# Patient Record
Sex: Female | Born: 1935 | Race: White | Hispanic: No | Smoking: Current every day smoker
Health system: Southern US, Community
[De-identification: ages and names within clinical notes are randomized; demographics above are authoritative.]

## PROBLEM LIST (undated history)

## (undated) DIAGNOSIS — F419 Anxiety disorder, unspecified: Secondary | ICD-10-CM

## (undated) DIAGNOSIS — K635 Polyp of colon: Secondary | ICD-10-CM

## (undated) DIAGNOSIS — I10 Essential (primary) hypertension: Secondary | ICD-10-CM

## (undated) DIAGNOSIS — E785 Hyperlipidemia, unspecified: Secondary | ICD-10-CM

## (undated) DIAGNOSIS — K219 Gastro-esophageal reflux disease without esophagitis: Secondary | ICD-10-CM

## (undated) DIAGNOSIS — E079 Disorder of thyroid, unspecified: Secondary | ICD-10-CM

## (undated) DIAGNOSIS — B019 Varicella without complication: Secondary | ICD-10-CM

## (undated) DIAGNOSIS — M1990 Unspecified osteoarthritis, unspecified site: Secondary | ICD-10-CM

## (undated) DIAGNOSIS — H3530 Unspecified macular degeneration: Secondary | ICD-10-CM

## (undated) HISTORY — DX: Unspecified macular degeneration: H35.30

## (undated) HISTORY — DX: Anxiety disorder, unspecified: F41.9

## (undated) HISTORY — DX: Gastro-esophageal reflux disease without esophagitis: K21.9

## (undated) HISTORY — DX: Essential (primary) hypertension: I10

## (undated) HISTORY — DX: Unspecified osteoarthritis, unspecified site: M19.90

## (undated) HISTORY — DX: Disorder of thyroid, unspecified: E07.9

## (undated) HISTORY — DX: Varicella without complication: B01.9

## (undated) HISTORY — DX: Polyp of colon: K63.5

## (undated) HISTORY — DX: Hyperlipidemia, unspecified: E78.5

---
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
## 1969-08-10 HISTORY — PX: TOTAL ABDOMINAL HYSTERECTOMY W/ BILATERAL SALPINGOOPHORECTOMY: SHX83

## 1994-08-10 HISTORY — PX: KNEE DISLOCATION SURGERY: SHX689

## 2004-11-06 ENCOUNTER — Ambulatory Visit: Payer: Self-pay | Admitting: Unknown Physician Specialty

## 2005-11-26 ENCOUNTER — Ambulatory Visit: Payer: Self-pay | Admitting: Unknown Physician Specialty

## 2006-02-18 ENCOUNTER — Ambulatory Visit: Payer: Self-pay | Admitting: Gastroenterology

## 2006-11-30 ENCOUNTER — Ambulatory Visit: Payer: Self-pay | Admitting: Unknown Physician Specialty

## 2007-12-23 ENCOUNTER — Ambulatory Visit: Payer: Self-pay | Admitting: Unknown Physician Specialty

## 2008-12-26 ENCOUNTER — Ambulatory Visit: Payer: Self-pay | Admitting: Unknown Physician Specialty

## 2009-12-31 ENCOUNTER — Ambulatory Visit: Payer: Self-pay | Admitting: Unknown Physician Specialty

## 2010-08-10 LAB — HM PAP SMEAR

## 2010-08-10 LAB — HM MAMMOGRAPHY

## 2011-01-20 ENCOUNTER — Ambulatory Visit: Payer: Self-pay | Admitting: Unknown Physician Specialty

## 2011-02-07 ENCOUNTER — Emergency Department: Payer: Self-pay | Admitting: Unknown Physician Specialty

## 2011-02-13 ENCOUNTER — Ambulatory Visit: Payer: Self-pay | Admitting: Unknown Physician Specialty

## 2011-04-14 ENCOUNTER — Ambulatory Visit: Payer: Self-pay | Admitting: Gastroenterology

## 2012-04-06 ENCOUNTER — Ambulatory Visit (INDEPENDENT_AMBULATORY_CARE_PROVIDER_SITE_OTHER): Payer: 59 | Admitting: Internal Medicine

## 2012-04-06 ENCOUNTER — Encounter: Payer: Self-pay | Admitting: Internal Medicine

## 2012-04-06 VITALS — BP 110/70 | HR 70 | Temp 98.6°F | Ht 62.0 in | Wt 162.5 lb

## 2012-04-06 DIAGNOSIS — Z1239 Encounter for other screening for malignant neoplasm of breast: Secondary | ICD-10-CM

## 2012-04-06 DIAGNOSIS — E785 Hyperlipidemia, unspecified: Secondary | ICD-10-CM

## 2012-04-06 DIAGNOSIS — Z23 Encounter for immunization: Secondary | ICD-10-CM

## 2012-04-06 DIAGNOSIS — D649 Anemia, unspecified: Secondary | ICD-10-CM

## 2012-04-06 DIAGNOSIS — I10 Essential (primary) hypertension: Secondary | ICD-10-CM

## 2012-04-06 LAB — CBC WITH DIFFERENTIAL/PLATELET
Basophils Relative: 1.2 % (ref 0.0–3.0)
Eosinophils Absolute: 0.2 10*3/uL (ref 0.0–0.7)
MCHC: 32.8 g/dL (ref 30.0–36.0)
MCV: 100.2 fl — ABNORMAL HIGH (ref 78.0–100.0)
Monocytes Absolute: 0.6 10*3/uL (ref 0.1–1.0)
Neutrophils Relative %: 65.7 % (ref 43.0–77.0)
RBC: 4.14 Mil/uL (ref 3.87–5.11)

## 2012-04-06 LAB — COMPREHENSIVE METABOLIC PANEL
AST: 21 U/L (ref 0–37)
Alkaline Phosphatase: 61 U/L (ref 39–117)
BUN: 20 mg/dL (ref 6–23)
Calcium: 9.8 mg/dL (ref 8.4–10.5)
Creatinine, Ser: 1.1 mg/dL (ref 0.4–1.2)
Total Bilirubin: 0.7 mg/dL (ref 0.3–1.2)

## 2012-04-06 LAB — LIPID PANEL
HDL: 49.6 mg/dL (ref >39.00)
Total CHOL/HDL Ratio: 4
Triglycerides: 167 mg/dL — ABNORMAL HIGH (ref 0.0–149.0)
VLDL: 33.4 mg/dL (ref 0.0–40.0)

## 2012-04-06 LAB — LDL CHOLESTEROL, DIRECT: Direct LDL: 131.7 mg/dL

## 2012-04-06 LAB — MICROALBUMIN / CREATININE URINE RATIO: Microalb, Ur: 0.2 mg/dL (ref 0.0–1.9)

## 2012-04-06 NOTE — Progress Notes (Signed)
Subjective:    Patient ID: Joann [name], female    DOB: 03/22/1933, 78 y.o.   MRN: [id number]  HPI 78 year old female with history of hypertension and hyperlipidemia presents to establish care. She reports she is generally feeling well. She reports full compliance with her medications. She denies any chest pain, palpitations, headache. She reports normal energy level. She is active participating in activities with family members including her husband of 56 years.  Outpatient Encounter Prescriptions as of 04/06/2012  Medication Sig Dispense Refill  . aspirin 81 MG tablet Take 81 mg by mouth daily.      Marland Kitchen atorvastatin (LIPITOR) 10 MG tablet Take 10 mg by mouth daily.      . Cholecalciferol (VITAMIN D-3) 1000 UNITS CAPS Take 1 capsule by mouth daily.      Marland Kitchen esomeprazole (NEXIUM) 40 MG capsule Take 40 mg by mouth daily before breakfast.      . etodolac (LODINE) 500 MG tablet Take 500 mg by mouth 2 (two) times daily.      Marland Kitchen ibandronate (BONIVA) 150 MG tablet Take 150 mg by mouth every 30 (thirty) days. Take in the morning with a full glass of water, on an empty stomach, and do not take anything else by mouth or lie down for the next 30 min.      Marland Kitchen levothyroxine (SYNTHROID, LEVOTHROID) 25 MCG tablet Take 25 mcg by mouth daily.      . magnesium 30 MG tablet Take 30 mg by mouth daily.      . Nutritional Supplements (ESTROVEN WEIGHT MANAGEMENT PO) Take 1 tablet by mouth 2 (two) times daily.      Marland Kitchen nystatin-triamcinolone (MYCOLOG II) cream Apply 1 application topically as needed.      . Simethicone (GAS RELIEF 125 MAX ST PO) Take 1 tablet by mouth as needed.      . Triamterene-HCTZ (MAXZIDE-25 PO) Take 1 tablet by mouth daily.      . valsartan (DIOVAN) 160 MG tablet Take 160 mg by mouth daily.       BP 110/70  Pulse 70  Temp 98.6 F (37 C) (Oral)  Ht 5\' 2"  (1.575 m)  Wt 162 lb 8 oz (73.71 kg)  BMI 29.72 kg/m2  SpO2 94%  Review of Systems  Constitutional: Negative for fever, chills, appetite  change, fatigue and unexpected weight change.  HENT: Negative for ear pain, congestion, sore throat, trouble swallowing, neck pain, voice change and sinus pressure.   Eyes: Negative for visual disturbance.  Respiratory: Negative for cough, shortness of breath, wheezing and stridor.   Cardiovascular: Negative for chest pain, palpitations and leg swelling.  Gastrointestinal: Negative for nausea, vomiting, abdominal pain, diarrhea, constipation, blood in stool, abdominal distention and anal bleeding.  Genitourinary: Negative for dysuria and flank pain.  Musculoskeletal: Negative for myalgias, arthralgias and gait problem.  Skin: Negative for color change and rash.  Neurological: Negative for dizziness and headaches.  Hematological: Negative for adenopathy. Does not bruise/bleed easily.  Psychiatric/Behavioral: Negative for suicidal ideas, disturbed wake/sleep cycle and dysphoric mood. The patient is not nervous/anxious.        Objective:   Physical Exam  Constitutional: She is oriented to person, place, and time. She appears well-developed and well-nourished. No distress.  HENT:  Head: Normocephalic and atraumatic.  Right Ear: External ear normal.  Left Ear: External ear normal.  Nose: Nose normal.  Mouth/Throat: Oropharynx is clear and moist. No oropharyngeal exudate.  Eyes: Conjunctivae are normal. Pupils are equal, round, and reactive to  light. Right eye exhibits no discharge. Left eye exhibits no discharge. No scleral icterus.  Neck: Normal range of motion. Neck supple. No tracheal deviation present. No thyromegaly present.  Cardiovascular: Normal rate, regular rhythm, normal heart sounds and intact distal pulses.  Exam reveals no gallop and no friction rub.   No murmur heard. Pulmonary/Chest: Effort normal and breath sounds normal. No respiratory distress. She has no wheezes. She has no rales. She exhibits no tenderness.  Abdominal: Soft. Bowel sounds are normal. She exhibits no  distension. There is no tenderness.  Musculoskeletal: Normal range of motion. She exhibits no edema and no tenderness.  Lymphadenopathy:    She has no cervical adenopathy.  Neurological: She is alert and oriented to person, place, and time. No cranial nerve deficit. She exhibits normal muscle tone. Coordination normal.  Skin: Skin is warm and dry. No rash noted. She is not diaphoretic. No erythema. No pallor.  Psychiatric: She has a normal mood and affect. Her behavior is normal. Judgment and thought content normal.          Assessment & Plan:

## 2012-04-06 NOTE — Assessment & Plan Note (Signed)
Will check lipids and LFTs with labs today. Continue atorvastatin. Followup in 3 months.

## 2012-04-06 NOTE — Assessment & Plan Note (Signed)
Blood pressures well-controlled on current medications. Will check renal function with labs today. Followup for physical exam in 4 months.

## 2012-04-08 ENCOUNTER — Ambulatory Visit: Payer: Self-pay | Admitting: Internal Medicine

## 2012-04-18 LAB — HM MAMMOGRAPHY: HM Mammogram: NORMAL

## 2012-04-20 ENCOUNTER — Encounter: Payer: Self-pay | Admitting: Internal Medicine

## 2012-07-18 ENCOUNTER — Encounter: Payer: Self-pay | Admitting: Internal Medicine

## 2012-07-18 ENCOUNTER — Ambulatory Visit (INDEPENDENT_AMBULATORY_CARE_PROVIDER_SITE_OTHER): Payer: Medicare Other | Admitting: Internal Medicine

## 2012-07-18 VITALS — BP 120/72 | HR 78 | Temp 97.6°F | Resp 16 | Ht 63.0 in | Wt 164.0 lb

## 2012-07-18 DIAGNOSIS — Z0000 Encounter for general adult medical examination without abnormal findings: Secondary | ICD-10-CM

## 2012-07-18 DIAGNOSIS — Z1331 Encounter for screening for depression: Secondary | ICD-10-CM

## 2012-07-18 MED ORDER — TRIAMTERENE-HCTZ 37.5-25 MG PO TABS: 1.0000 | ORAL_TABLET | Freq: Every day | ORAL | Status: DC

## 2012-07-18 MED ORDER — ESOMEPRAZOLE MAGNESIUM 40 MG PO CPDR: 40.0000 mg | DELAYED_RELEASE_CAPSULE | Freq: Every day | ORAL | Status: DC

## 2012-07-18 MED ORDER — LEVOTHYROXINE SODIUM 25 MCG PO TABS: 25.0000 ug | ORAL_TABLET | Freq: Every day | ORAL | Status: DC

## 2012-07-18 MED ORDER — VALSARTAN 160 MG PO TABS: 160.0000 mg | ORAL_TABLET | Freq: Every day | ORAL | Status: DC

## 2012-07-18 MED ORDER — ATORVASTATIN CALCIUM 10 MG PO TABS: 10.0000 mg | ORAL_TABLET | Freq: Every day | ORAL | Status: DC

## 2012-07-18 MED ORDER — IBANDRONATE SODIUM 150 MG PO TABS: 150.0000 mg | ORAL_TABLET | ORAL | Status: DC

## 2012-07-18 MED ORDER — ETODOLAC 500 MG PO TABS: 500.0000 mg | ORAL_TABLET | Freq: Two times a day (BID) | ORAL | Status: DC

## 2012-07-18 NOTE — Progress Notes (Signed)
Subjective:    Patient ID: Joann [name], female    DOB: [birth year], 78 y.o.   MRN: [id number]  HPI The patient is here for annual Medicare wellness examination and management of other chronic and acute problems.   The risk factors are reflected in the social history.  The roster of all physicians providing medical care to patient - is listed in the Snapshot section of the chart.  Activities of daily living:  The patient is 100% independent in all ADLs: dressing, toileting, feeding as well as independent mobility  Home safety : The patient has smoke detectors in the home. They wear seatbelts.  There are locked firearms at home. There is no violence in the home.   There is no risks for hepatitis, STDs or HIV. There is no history of blood transfusion. They have no travel history to infectious disease endemic areas of the world.  The patient has seen their dentist in the last six month. (Dr. Smitty Cords) They have seen their eye doctor in the last year. Followed for macular degeneration. (Dr. Burnell Blanks and Dr. Allyne Gee) No issues with hearing. They have deferred audiologic testing in the last year.   They do not  have excessive sun exposure. Discussed the need for sun protection: hats, long sleeves and use of sunscreen if there is significant sun exposure. (Dr. Orson Aloe)  Diet: the importance of a healthy diet is discussed. They do have a healthy diet.  The benefits of regular aerobic exercise were discussed. She walks several times per week. Limited by arthritis.  Depression screen: there are no signs or vegative symptoms of depression- irritability, change in appetite, anhedonia, sadness/tearfullness.  Cognitive assessment: the patient manages all their financial and personal affairs and is actively engaged. They could relate day,date,year and events.  The following portions of the patient's history were reviewed and updated as appropriate: allergies, current medications, past family  history, past medical history,  past surgical history, past social history  and problem list.  Visual acuity was not assessed per patient preference since she has regular follow up with her ophthalmologist. Hearing and body mass index were assessed and reviewed.   During the course of the visit the patient was educated and counseled about appropriate screening and preventive services including : fall prevention , diabetes screening, nutrition counseling, colorectal cancer screening, and recommended immunizations.     Outpatient Encounter Prescriptions as of 07/18/2012  Medication Sig Dispense Refill  . aspirin 81 MG tablet Take 81 mg by mouth daily.      Marland Kitchen atorvastatin (LIPITOR) 10 MG tablet Take 10 mg by mouth daily.      . Cholecalciferol (VITAMIN D-3) 1000 UNITS CAPS Take 1 capsule by mouth daily.      Marland Kitchen esomeprazole (NEXIUM) 40 MG capsule Take 40 mg by mouth daily before breakfast.      . etodolac (LODINE) 500 MG tablet Take 500 mg by mouth 2 (two) times daily.      Marland Kitchen ibandronate (BONIVA) 150 MG tablet Take 150 mg by mouth every 30 (thirty) days. Take in the morning with a full glass of water, on an empty stomach, and do not take anything else by mouth or lie down for the next 30 min.      Marland Kitchen levothyroxine (SYNTHROID, LEVOTHROID) 25 MCG tablet Take 25 mcg by mouth daily.      . magnesium 30 MG tablet Take 30 mg by mouth daily.      . Nutritional Supplements (ESTROVEN WEIGHT MANAGEMENT PO) Take 1  tablet by mouth 2 (two) times daily.      Marland Kitchen nystatin-triamcinolone (MYCOLOG II) cream Apply 1 application topically as needed.      . Simethicone (GAS RELIEF 125 MAX ST PO) Take 1 tablet by mouth as needed.      . Triamterene-HCTZ (MAXZIDE-25 PO) Take 1 tablet by mouth daily.      . valsartan (DIOVAN) 160 MG tablet Take 160 mg by mouth daily.       BP 120/72  Pulse 78  Temp 97.6 F (36.4 C) (Oral)  Resp 16  Ht 5\' 3"  (1.6 m)  Wt 164 lb (74.39 kg)  BMI 29.05 kg/m2  SpO2 97%  Review of Systems   Constitutional: Negative for fever, chills, appetite change, fatigue and unexpected weight change.  HENT: Negative for ear pain, congestion, sore throat, trouble swallowing, neck pain, voice change and sinus pressure.   Eyes: Negative for visual disturbance.  Respiratory: Negative for cough, shortness of breath, wheezing and stridor.   Cardiovascular: Negative for chest pain, palpitations and leg swelling.  Gastrointestinal: Negative for nausea, vomiting, abdominal pain, diarrhea, constipation, blood in stool, abdominal distention and anal bleeding.  Genitourinary: Negative for dysuria and flank pain.  Musculoskeletal: Negative for myalgias, arthralgias and gait problem.  Skin: Negative for color change and rash.  Neurological: Negative for dizziness and headaches.  Hematological: Negative for adenopathy. Does not bruise/bleed easily.  Psychiatric/Behavioral: Negative for suicidal ideas, sleep disturbance and dysphoric mood. The patient is not nervous/anxious.    Outpatient Encounter Prescriptions as of 07/18/2012  Medication Sig Dispense Refill  . aspirin 81 MG tablet Take 81 mg by mouth daily.      Marland Kitchen atorvastatin (LIPITOR) 10 MG tablet Take 1 tablet (10 mg total) by mouth daily.  90 tablet  4  . Cholecalciferol (VITAMIN D-3) 1000 UNITS CAPS Take 1 capsule by mouth daily.      Marland Kitchen esomeprazole (NEXIUM) 40 MG capsule Take 1 capsule (40 mg total) by mouth daily before breakfast.  90 capsule  4  . etodolac (LODINE) 500 MG tablet Take 1 tablet (500 mg total) by mouth 2 (two) times daily.  180 tablet  4  . ibandronate (BONIVA) 150 MG tablet Take 1 tablet (150 mg total) by mouth every 30 (thirty) days. Take in the morning with a full glass of water, on an empty stomach, and do not take anything else by mouth or lie down for the next 30 min.  12 tablet  4  . levothyroxine (SYNTHROID, LEVOTHROID) 25 MCG tablet Take 1 tablet (25 mcg total) by mouth daily.  90 tablet  4  . magnesium 30 MG tablet Take 30  mg by mouth daily.      . Nutritional Supplements (ESTROVEN WEIGHT MANAGEMENT PO) Take 1 tablet by mouth 2 (two) times daily.      Marland Kitchen nystatin-triamcinolone (MYCOLOG II) cream Apply 1 application topically as needed.      . Simethicone (GAS RELIEF 125 MAX ST PO) Take 1 tablet by mouth as needed.      . triamterene-hydrochlorothiazide (MAXZIDE-25) 37.5-25 MG per tablet Take 1 each (1 tablet total) by mouth daily.  90 tablet  4  . valsartan (DIOVAN) 160 MG tablet Take 1 tablet (160 mg total) by mouth daily.  90 tablet  4   BP 120/72  Pulse 78  Temp 97.6 F (36.4 C) (Oral)  Resp 16  Ht 5\' 3"  (1.6 m)  Wt 164 lb (74.39 kg)  BMI 29.05 kg/m2  SpO2 97%  Objective:   Physical Exam  Constitutional: She is oriented to person, place, and time. She appears well-developed and well-nourished. No distress.  HENT:  Head: Normocephalic and atraumatic.  Right Ear: External ear normal.  Left Ear: External ear normal.  Nose: Nose normal.  Mouth/Throat: Oropharynx is clear and moist. No oropharyngeal exudate.  Eyes: Conjunctivae normal are normal. Pupils are equal, round, and reactive to light. Right eye exhibits no discharge. Left eye exhibits no discharge. No scleral icterus.  Neck: Normal range of motion. Neck supple. No tracheal deviation present. No thyromegaly present.  Cardiovascular: Normal rate, regular rhythm, normal heart sounds and intact distal pulses.  Exam reveals no gallop and no friction rub.   No murmur heard. Pulmonary/Chest: Effort normal and breath sounds normal. No accessory muscle usage. Not tachypneic. No respiratory distress. She has no decreased breath sounds. She has no wheezes. She has no rhonchi. She has no rales. She exhibits no tenderness. Right breast exhibits no inverted nipple, no mass, no nipple discharge, no skin change and no tenderness. Left breast exhibits no inverted nipple, no mass, no nipple discharge, no skin change and no tenderness. Breasts are symmetrical.   Abdominal: Soft. Bowel sounds are normal. She exhibits no distension and no mass. There is no tenderness. There is no rebound and no guarding.  Musculoskeletal: Normal range of motion. She exhibits no edema and no tenderness.  Lymphadenopathy:    She has no cervical adenopathy.  Neurological: She is alert and oriented to person, place, and time. No cranial nerve deficit. She exhibits normal muscle tone. Coordination normal.  Skin: Skin is warm and dry. No rash noted. She is not diaphoretic. No erythema. No pallor.  Psychiatric: She has a normal mood and affect. Her behavior is normal. Judgment and thought content normal.          Assessment & Plan:

## 2012-07-18 NOTE — Assessment & Plan Note (Signed)
General medical exam including breast exam is normal today. Pap is deferred as this was normal in 2012. Health maintenance is up to date. Reviewed recent labs including kidney and liver function and cholesterol. Encouraged continued efforts at healthy diet and regular physical activity. Appropriate screening performed. Followup in 4 months or sooner as needed.

## 2012-07-20 ENCOUNTER — Other Ambulatory Visit: Payer: Self-pay | Admitting: General Practice

## 2012-07-20 MED ORDER — IBANDRONATE SODIUM 150 MG PO TABS: 150.0000 mg | ORAL_TABLET | ORAL | Status: DC

## 2012-11-21 ENCOUNTER — Encounter: Payer: Self-pay | Admitting: Internal Medicine

## 2012-11-21 ENCOUNTER — Ambulatory Visit (INDEPENDENT_AMBULATORY_CARE_PROVIDER_SITE_OTHER): Payer: Medicare Other | Admitting: Internal Medicine

## 2012-11-21 VITALS — BP 130/70 | HR 72 | Temp 97.9°F | Wt 165.0 lb

## 2012-11-21 DIAGNOSIS — L259 Unspecified contact dermatitis, unspecified cause: Secondary | ICD-10-CM

## 2012-11-21 DIAGNOSIS — E785 Hyperlipidemia, unspecified: Secondary | ICD-10-CM

## 2012-11-21 DIAGNOSIS — G5602 Carpal tunnel syndrome, left upper limb: Secondary | ICD-10-CM

## 2012-11-21 DIAGNOSIS — G5600 Carpal tunnel syndrome, unspecified upper limb: Secondary | ICD-10-CM

## 2012-11-21 DIAGNOSIS — E039 Hypothyroidism, unspecified: Secondary | ICD-10-CM

## 2012-11-21 DIAGNOSIS — L309 Dermatitis, unspecified: Secondary | ICD-10-CM

## 2012-11-21 DIAGNOSIS — I10 Essential (primary) hypertension: Secondary | ICD-10-CM

## 2012-11-21 LAB — COMPREHENSIVE METABOLIC PANEL
Alkaline Phosphatase: 55 U/L (ref 39–117)
BUN: 25 mg/dL — ABNORMAL HIGH (ref 6–23)
CO2: 27 mEq/L (ref 19–32)
Creatinine, Ser: 1.1 mg/dL (ref 0.4–1.2)
GFR: 53.34 mL/min — ABNORMAL LOW (ref >60.00)
Glucose, Bld: 90 mg/dL (ref 70–99)
Sodium: 139 mEq/L (ref 135–145)
Total Bilirubin: 0.6 mg/dL (ref 0.3–1.2)
Total Protein: 7.5 g/dL (ref 6.0–8.3)

## 2012-11-21 LAB — MICROALBUMIN / CREATININE URINE RATIO
Creatinine,U: 40 mg/dL
Microalb Creat Ratio: 1 mg/g (ref 0.0–30.0)
Microalb, Ur: 0.4 mg/dL (ref 0.0–1.9)

## 2012-11-21 LAB — TSH: TSH: 1.62 u[IU]/mL (ref 0.35–5.50)

## 2012-11-21 NOTE — Assessment & Plan Note (Signed)
Symptoms most consistent with contact dermatitis. Discussed using topical steroid cream. Patient would prefer to wait till she discussed this with her dermatologist later this week. Follow up prn.

## 2012-11-21 NOTE — Progress Notes (Signed)
Subjective:    Patient ID: Joann [name], female    DOB: April 25, 1933, 79 y.o.   MRN: [id number]  HPI 79 year old female with history of hypertension, hyperlipidemia presents for followup. She reports she is generally feeling well. She denies any recent chest pain, shortness of breath, headache. She is compliant with her medication. She has 2 concerns today. First, she notes a couple of weeks ago a red itchy rash developed on her right medial thigh. She denies any exposure to new lotions, perfumes, detergents. Over time, the rash is going away without any intervention.  She is also concerned today about intermittent numbness in her left hand. This is described as numbness and tingling which often wakes her from sleep. It improves with shaking her hand. In the past, she was diagnosed with carpal tunnel syndrome and treated by orthopedic surgery. She used a wrist brace with some improvement in the past.  Outpatient Encounter Prescriptions as of 11/21/2012  Medication Sig Dispense Refill  . aspirin 81 MG tablet Take 81 mg by mouth daily.      Marland Kitchen atorvastatin (LIPITOR) 10 MG tablet Take 1 tablet (10 mg total) by mouth daily.  90 tablet  4  . Cholecalciferol (VITAMIN D-3) 1000 UNITS CAPS Take 1 capsule by mouth daily.      Marland Kitchen esomeprazole (NEXIUM) 40 MG capsule Take 1 capsule (40 mg total) by mouth daily before breakfast.  90 capsule  4  . etodolac (LODINE) 500 MG tablet Take 1 tablet (500 mg total) by mouth 2 (two) times daily.  180 tablet  4  . levothyroxine (SYNTHROID, LEVOTHROID) 25 MCG tablet Take 1 tablet (25 mcg total) by mouth daily.  90 tablet  4  . magnesium 30 MG tablet Take 30 mg by mouth daily.      . Nutritional Supplements (ESTROVEN WEIGHT MANAGEMENT PO) Take 1 tablet by mouth 2 (two) times daily.      Marland Kitchen nystatin-triamcinolone (MYCOLOG II) cream Apply 1 application topically as needed.      . Simethicone (GAS RELIEF 125 MAX ST PO) Take 1 tablet by mouth as needed.      . tobramycin  (TOBREX) 0.3 % ophthalmic solution       . triamterene-hydrochlorothiazide (MAXZIDE-25) 37.5-25 MG per tablet Take 1 each (1 tablet total) by mouth daily.  90 tablet  4  . valsartan (DIOVAN) 160 MG tablet Take 1 tablet (160 mg total) by mouth daily.  90 tablet  4  . ibandronate (BONIVA) 150 MG tablet Take 1 tablet (150 mg total) by mouth every 30 (thirty) days. Take in the morning with a full glass of water, on an empty stomach, and do not take anything else by mouth or lie down for the next 60 min.  12 tablet  4   No facility-administered encounter medications on file as of 11/21/2012.   BP 130/70  Pulse 72  Temp(Src) 97.9 F (36.6 C) (Oral)  Wt 165 lb (74.844 kg)  BMI 29.24 kg/m2  SpO2 94%  Review of Systems  Constitutional: Negative for fever, chills, appetite change, fatigue and unexpected weight change.  HENT: Negative for ear pain, congestion, sore throat, trouble swallowing, neck pain, voice change and sinus pressure.   Eyes: Negative for visual disturbance.  Respiratory: Negative for cough, shortness of breath, wheezing and stridor.   Cardiovascular: Negative for chest pain, palpitations and leg swelling.  Gastrointestinal: Negative for nausea, vomiting, abdominal pain, diarrhea, constipation, blood in stool, abdominal distention and anal bleeding.  Genitourinary: Negative for dysuria  and flank pain.  Musculoskeletal: Negative for myalgias, arthralgias and gait problem.  Skin: Negative for color change and rash.  Neurological: Positive for numbness. Negative for dizziness, weakness and headaches.  Hematological: Negative for adenopathy. Does not bruise/bleed easily.  Psychiatric/Behavioral: Negative for suicidal ideas, sleep disturbance and dysphoric mood. The patient is not nervous/anxious.        Objective:   Physical Exam  Constitutional: She is oriented to person, place, and time. She appears well-developed and well-nourished. No distress.  HENT:  Head: Normocephalic and  atraumatic.  Right Ear: External ear normal.  Left Ear: External ear normal.  Nose: Nose normal.  Mouth/Throat: Oropharynx is clear and moist. No oropharyngeal exudate.  Eyes: Conjunctivae are normal. Pupils are equal, round, and reactive to light. Right eye exhibits no discharge. Left eye exhibits no discharge. No scleral icterus.  Neck: Normal range of motion. Neck supple. No tracheal deviation present. No thyromegaly present.  Cardiovascular: Normal rate, regular rhythm, normal heart sounds and intact distal pulses.  Exam reveals no gallop and no friction rub.   No murmur heard. Pulmonary/Chest: Effort normal and breath sounds normal. No accessory muscle usage. Not tachypneic. No respiratory distress. She has no decreased breath sounds. She has no wheezes. She has no rhonchi. She has no rales. She exhibits no tenderness.  Musculoskeletal: Normal range of motion. She exhibits no edema and no tenderness.  Lymphadenopathy:    She has no cervical adenopathy.  Neurological: She is alert and oriented to person, place, and time. No cranial nerve deficit. She exhibits normal muscle tone. Coordination normal.  Skin: Skin is warm and dry. Rash (maculopapular, erythematous rash right medial thigh) noted. She is not diaphoretic. There is erythema. No pallor.  Psychiatric: She has a normal mood and affect. Her behavior is normal. Judgment and thought content normal.          Assessment & Plan:

## 2012-11-21 NOTE — Assessment & Plan Note (Signed)
BP Readings from Last 3 Encounters:  11/21/12 130/70  07/18/12 120/72  04/06/12 110/70   Blood pressure well-controlled today. We'll continue current medications. Will check renal function with labs.

## 2012-11-21 NOTE — Assessment & Plan Note (Signed)
Symptoms of left hand tingling during the night are most consistent with carpal tunnel. Patient has been evaluated and treated for this in the past by orthopedic surgery. Discussed using a wrist brace at bedtime. She will follow up with her orthopedic surgeon as well.

## 2012-11-21 NOTE — Assessment & Plan Note (Addendum)
Check LFTs with labs today. Continue Atorvastatin 

## 2012-11-22 ENCOUNTER — Encounter: Payer: Self-pay | Admitting: *Deleted

## 2013-05-15 LAB — HM MAMMOGRAPHY: HM Mammogram: NORMAL

## 2013-05-23 ENCOUNTER — Encounter: Payer: Self-pay | Admitting: Internal Medicine

## 2013-05-23 ENCOUNTER — Ambulatory Visit (INDEPENDENT_AMBULATORY_CARE_PROVIDER_SITE_OTHER): Payer: Medicare Other | Admitting: Internal Medicine

## 2013-05-23 VITALS — BP 118/70 | HR 82 | Temp 97.9°F | Wt 145.0 lb

## 2013-05-23 DIAGNOSIS — F4329 Adjustment disorder with other symptoms: Secondary | ICD-10-CM

## 2013-05-23 DIAGNOSIS — I10 Essential (primary) hypertension: Secondary | ICD-10-CM

## 2013-05-23 DIAGNOSIS — E785 Hyperlipidemia, unspecified: Secondary | ICD-10-CM

## 2013-05-23 DIAGNOSIS — Z1239 Encounter for other screening for malignant neoplasm of breast: Secondary | ICD-10-CM

## 2013-05-23 DIAGNOSIS — F4321 Adjustment disorder with depressed mood: Secondary | ICD-10-CM

## 2013-05-23 LAB — COMPREHENSIVE METABOLIC PANEL
ALT: 22 U/L (ref 0–35)
Alkaline Phosphatase: 76 U/L (ref 39–117)
Creatinine, Ser: 1 mg/dL (ref 0.4–1.2)
Sodium: 143 mEq/L (ref 135–145)
Total Bilirubin: 0.5 mg/dL (ref 0.3–1.2)
Total Protein: 7.1 g/dL (ref 6.0–8.3)

## 2013-05-23 LAB — LIPID PANEL
Cholesterol: 163 mg/dL (ref 0–200)
LDL Cholesterol: 84 mg/dL (ref 0–99)
Triglycerides: 139 mg/dL (ref 0.0–149.0)
VLDL: 27.8 mg/dL (ref 0.0–40.0)

## 2013-05-23 LAB — MICROALBUMIN / CREATININE URINE RATIO: Microalb Creat Ratio: 0.4 mg/g (ref 0.0–30.0)

## 2013-05-23 MED ORDER — SERTRALINE HCL 50 MG PO TABS: 50.0000 mg | ORAL_TABLET | Freq: Every day | ORAL | Status: DC

## 2013-05-23 NOTE — Patient Instructions (Addendum)
Start Sertraline 25mg  daily(half tablet) x 4 days, then increase to full 50mg  tablet daily.  Call if any questions or concerns.  Follow up 2-4 weeks.

## 2013-05-23 NOTE — Assessment & Plan Note (Signed)
BP Readings from Last 3 Encounters:  05/23/13 118/70  11/21/12 130/70  07/18/12 120/72   Blood pressure well-controlled on current medications. Will check renal function with labs.

## 2013-05-23 NOTE — Assessment & Plan Note (Signed)
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 Offered support today. Will start sertraline 50 mg daily. Recommended that she start by taking 25 mg daily for 4 days then take 50 mg daily after that. Recommended counseling which she declined. Followup 2 weeks or sooner as needed.

## 2013-05-23 NOTE — Assessment & Plan Note (Signed)
Will check lipids and LFTs with labs. Continue atorvastatin. 

## 2013-05-23 NOTE — Progress Notes (Signed)
Subjective:    Patient ID: Joann [name], female    DOB: Mar 13, 1933, 79 y.o.   MRN: [id number]  HPI 79 year old female with history of hypertension and hyperlipidemia presents for followup. Her primary concern today is increased anxiety and symptoms of depressed mood after her husband's death in 02-Apr-2013. She reports she is tearful on a daily basis. She is not interested in participating in her usual activities. She has a poor appetite. She reports some difficulty with short-term memory. Her daughter recommended that she consider starting medication, specifically citalopram. No suicidal ideation. Not interested in counseling. She reports good support from family and friends.  Aside from this, she is feeling well. Compliant with medications. No recent chest pain, headache, palpitations.  Outpatient Encounter Prescriptions as of 05/23/2013  Medication Sig Dispense Refill  . aspirin 81 MG tablet Take 81 mg by mouth daily.      Marland Kitchen atorvastatin (LIPITOR) 10 MG tablet Take 1 tablet (10 mg total) by mouth daily.  90 tablet  4  . Cholecalciferol (VITAMIN D-3) 1000 UNITS CAPS Take 1 capsule by mouth daily.      Marland Kitchen esomeprazole (NEXIUM) 40 MG capsule Take 1 capsule (40 mg total) by mouth daily before breakfast.  90 capsule  4  . etodolac (LODINE) 500 MG tablet Take 1 tablet (500 mg total) by mouth 2 (two) times daily.  180 tablet  4  . levothyroxine (SYNTHROID, LEVOTHROID) 25 MCG tablet Take 1 tablet (25 mcg total) by mouth daily.  90 tablet  4  . magnesium 30 MG tablet Take 30 mg by mouth daily.      . Nutritional Supplements (ESTROVEN WEIGHT MANAGEMENT PO) Take 1 tablet by mouth 2 (two) times daily.      Marland Kitchen nystatin-triamcinolone (MYCOLOG II) cream Apply 1 application topically as needed.      . tobramycin (TOBREX) 0.3 % ophthalmic solution       . triamterene-hydrochlorothiazide (MAXZIDE-25) 37.5-25 MG per tablet Take 1 each (1 tablet total) by mouth daily.  90 tablet  4  . valsartan (DIOVAN) 160  MG tablet Take 1 tablet (160 mg total) by mouth daily.  90 tablet  4  . ibandronate (BONIVA) 150 MG tablet Take 1 tablet (150 mg total) by mouth every 30 (thirty) days. Take in the morning with a full glass of water, on an empty stomach, and do not take anything else by mouth or lie down for the next 60 min.  12 tablet  4  . sertraline (ZOLOFT) 50 MG tablet Take 1 tablet (50 mg total) by mouth daily.  30 tablet  3  . Simethicone (GAS RELIEF 125 MAX ST PO) Take 1 tablet by mouth as needed.       No facility-administered encounter medications on file as of 05/23/2013.   BP 118/70  Pulse 82  Temp(Src) 97.9 F (36.6 C) (Oral)  Wt 145 lb (65.772 kg)  BMI 25.69 kg/m2  SpO2 95%  Review of Systems  Constitutional: Negative for fever, chills, appetite change, fatigue and unexpected weight change.  HENT: Negative for congestion, ear pain, sinus pressure, sore throat, trouble swallowing and voice change.   Eyes: Negative for visual disturbance.  Respiratory: Negative for cough, shortness of breath, wheezing and stridor.   Cardiovascular: Negative for chest pain, palpitations and leg swelling.  Gastrointestinal: Negative for nausea, vomiting, abdominal pain, diarrhea, constipation, blood in stool, abdominal distention and anal bleeding.  Genitourinary: Negative for dysuria and flank pain.  Musculoskeletal: Negative for arthralgias, gait problem, myalgias  and neck pain.  Skin: Negative for color change and rash.  Neurological: Negative for dizziness and headaches.  Hematological: Negative for adenopathy. Does not bruise/bleed easily.  Psychiatric/Behavioral: Positive for sleep disturbance and dysphoric mood. Negative for suicidal ideas. The patient is nervous/anxious.        Objective:   Physical Exam  Constitutional: She is oriented to person, place, and time. She appears well-developed and well-nourished. No distress.  HENT:  Head: Normocephalic and atraumatic.  Right Ear: External ear  normal.  Left Ear: External ear normal.  Nose: Nose normal.  Mouth/Throat: Oropharynx is clear and moist. No oropharyngeal exudate.  Eyes: Conjunctivae are normal. Pupils are equal, round, and reactive to light. Right eye exhibits no discharge. Left eye exhibits no discharge. No scleral icterus.  Neck: Normal range of motion. Neck supple. No tracheal deviation present. No thyromegaly present.  Cardiovascular: Normal rate, regular rhythm, normal heart sounds and intact distal pulses.  Exam reveals no gallop and no friction rub.   No murmur heard. Pulmonary/Chest: Effort normal and breath sounds normal. No accessory muscle usage. Not tachypneic. No respiratory distress. She has no decreased breath sounds. She has no wheezes. She has no rhonchi. She has no rales. She exhibits no tenderness.  Musculoskeletal: Normal range of motion. She exhibits no edema and no tenderness.  Lymphadenopathy:    She has no cervical adenopathy.  Neurological: She is alert and oriented to person, place, and time. No cranial nerve deficit. She exhibits normal muscle tone. Coordination normal.  Skin: Skin is warm and dry. No rash noted. She is not diaphoretic. No erythema. No pallor.  Psychiatric: Her speech is normal and behavior is normal. Judgment and thought content normal. Her mood appears anxious. Cognition and memory are normal. She exhibits a depressed mood. She expresses no suicidal ideation.          Assessment & Plan:

## 2013-05-24 ENCOUNTER — Encounter: Payer: Self-pay | Admitting: *Deleted

## 2013-05-31 ENCOUNTER — Telehealth: Payer: Self-pay | Admitting: Internal Medicine

## 2013-05-31 NOTE — Telephone Encounter (Signed)
Daughter called to make Joann [name]'s appointment 11/10 and wanted to let you know that the meds you put Joann Guier on is working good for her.  She is not crying all the time

## 2013-05-31 NOTE — Telephone Encounter (Signed)
FYI... To Dr. Dan Humphreys

## 2013-06-01 ENCOUNTER — Ambulatory Visit: Payer: Self-pay | Admitting: Internal Medicine

## 2013-06-19 ENCOUNTER — Ambulatory Visit: Payer: Self-pay | Admitting: Internal Medicine

## 2013-06-20 ENCOUNTER — Encounter: Payer: Self-pay | Admitting: Internal Medicine

## 2013-07-10 ENCOUNTER — Ambulatory Visit (INDEPENDENT_AMBULATORY_CARE_PROVIDER_SITE_OTHER): Payer: Medicare Other | Admitting: Internal Medicine

## 2013-07-10 ENCOUNTER — Encounter: Payer: Self-pay | Admitting: Internal Medicine

## 2013-07-10 ENCOUNTER — Encounter (INDEPENDENT_AMBULATORY_CARE_PROVIDER_SITE_OTHER): Payer: Self-pay

## 2013-07-10 VITALS — BP 110/60 | HR 59 | Temp 97.5°F | Wt 143.0 lb

## 2013-07-10 DIAGNOSIS — F4321 Adjustment disorder with depressed mood: Secondary | ICD-10-CM

## 2013-07-10 DIAGNOSIS — H3530 Unspecified macular degeneration: Secondary | ICD-10-CM

## 2013-07-10 DIAGNOSIS — F4329 Adjustment disorder with other symptoms: Secondary | ICD-10-CM

## 2013-07-10 NOTE — Progress Notes (Signed)
Subjective:    Patient ID: Joann [name], female    DOB: November 09, 1932, 79 y.o.   MRN: [id number]  HPI 79 year old female with recent history of complicated bereavement presents for followup. At her last visit we started sertraline 50 mg daily. She reports that this medication made her feel drowsy and she has been taking only 25 mg daily. She reports some improvement in symptoms of depressed mood and tearfulness. It continues to be a difficult time for her, particularly with the holidays. She has great support from her daughter. She does have difficulty sleeping in her home but is working through this. She is not currently undergoing counseling.  Outpatient Encounter Prescriptions as of 07/10/2013  Medication Sig  . atorvastatin (LIPITOR) 10 MG tablet Take 1 tablet (10 mg total) by mouth daily.  . Cholecalciferol (VITAMIN D-3) 1000 UNITS CAPS Take 1 capsule by mouth daily.  Marland Kitchen esomeprazole (NEXIUM) 40 MG capsule Take 1 capsule (40 mg total) by mouth daily before breakfast.  . etodolac (LODINE) 500 MG tablet Take 1 tablet (500 mg total) by mouth 2 (two) times daily.  Marland Kitchen levothyroxine (SYNTHROID, LEVOTHROID) 25 MCG tablet Take 1 tablet (25 mcg total) by mouth daily.  . magnesium 30 MG tablet Take 30 mg by mouth daily.  Marland Kitchen nystatin-triamcinolone (MYCOLOG II) cream Apply 1 application topically as needed.  . sertraline (ZOLOFT) 50 MG tablet Take 1 tablet (50 mg total) by mouth daily.  . Simethicone (GAS RELIEF 125 MAX ST PO) Take 1 tablet by mouth as needed.  . valsartan (DIOVAN) 160 MG tablet Take 1 tablet (160 mg total) by mouth daily.  Marland Kitchen triamterene-hydrochlorothiazide (MAXZIDE-25) 37.5-25 MG per tablet Take 1 each (1 tablet total) by mouth daily.   BP 110/60  Pulse 59  Temp(Src) 97.5 F (36.4 C) (Oral)  Wt 143 lb (64.864 kg)  SpO2 96%  Review of Systems  Constitutional: Negative for fever, chills, appetite change, fatigue and unexpected weight change.  Eyes: Negative for visual  disturbance.  Respiratory: Negative for cough and shortness of breath.   Cardiovascular: Negative for chest pain, palpitations and leg swelling.  Gastrointestinal: Negative for abdominal pain and abdominal distention.  Genitourinary: Negative for dysuria and flank pain.  Musculoskeletal: Negative for arthralgias, gait problem, myalgias and neck pain.  Skin: Negative for color change and rash.  Neurological: Negative for dizziness and headaches.  Hematological: Negative for adenopathy. Does not bruise/bleed easily.  Psychiatric/Behavioral: Positive for sleep disturbance and dysphoric mood. Negative for suicidal ideas. The patient is not nervous/anxious.        Objective:   Physical Exam  Constitutional: She is oriented to person, place, and time. She appears well-developed and well-nourished. No distress.  HENT:  Head: Normocephalic and atraumatic.  Right Ear: External ear normal.  Left Ear: External ear normal.  Nose: Nose normal.  Mouth/Throat: Oropharynx is clear and moist. No oropharyngeal exudate.  Eyes: Conjunctivae are normal. Pupils are equal, round, and reactive to light. Right eye exhibits no discharge. Left eye exhibits no discharge. No scleral icterus.  Neck: Normal range of motion. Neck supple. No tracheal deviation present. No thyromegaly present.  Cardiovascular: Normal rate, regular rhythm, normal heart sounds and intact distal pulses.  Exam reveals no gallop and no friction rub.   No murmur heard. Pulmonary/Chest: Effort normal and breath sounds normal. No accessory muscle usage. Not tachypneic. No respiratory distress. She has no decreased breath sounds. She has no wheezes. She has no rhonchi. She has no rales. She exhibits no tenderness.  Musculoskeletal: Normal range of motion. She exhibits no edema and no tenderness.  Lymphadenopathy:    She has no cervical adenopathy.  Neurological: She is alert and oriented to person, place, and time. No cranial nerve deficit. She  exhibits normal muscle tone. Coordination normal.  Skin: Skin is warm and dry. No rash noted. She is not diaphoretic. No erythema. No pallor.  Psychiatric: Her speech is normal and behavior is normal. Judgment and thought content normal. She exhibits a depressed mood.          Assessment & Plan:

## 2013-07-10 NOTE — Progress Notes (Signed)
Pre-visit discussion using our clinic review tool. No additional management support is needed unless otherwise documented below in the visit note.  

## 2013-07-10 NOTE — Assessment & Plan Note (Signed)
Symptoms of depressed mood somewhat improved on Sertraline. Will continue. Pt has excellent support system with family and friends. Follow up 2 months and prn.

## 2013-08-15 ENCOUNTER — Encounter: Payer: Self-pay | Admitting: Internal Medicine

## 2013-08-15 ENCOUNTER — Ambulatory Visit (INDEPENDENT_AMBULATORY_CARE_PROVIDER_SITE_OTHER): Payer: Medicare HMO | Admitting: Internal Medicine

## 2013-08-15 VITALS — BP 118/56 | HR 74 | Temp 98.1°F | Ht 61.0 in | Wt 142.0 lb

## 2013-08-15 DIAGNOSIS — Z634 Disappearance and death of family member: Secondary | ICD-10-CM

## 2013-08-15 DIAGNOSIS — F4329 Adjustment disorder with other symptoms: Secondary | ICD-10-CM

## 2013-08-15 DIAGNOSIS — Z0000 Encounter for general adult medical examination without abnormal findings: Secondary | ICD-10-CM

## 2013-08-15 DIAGNOSIS — F4321 Adjustment disorder with depressed mood: Secondary | ICD-10-CM

## 2013-08-15 MED ORDER — LEVOTHYROXINE SODIUM 25 MCG PO TABS: 25.0000 ug | ORAL_TABLET | Freq: Every day | ORAL | Status: DC

## 2013-08-15 MED ORDER — SERTRALINE HCL 50 MG PO TABS: 50.0000 mg | ORAL_TABLET | Freq: Every day | ORAL | Status: DC

## 2013-08-15 MED ORDER — VALSARTAN 160 MG PO TABS: 160.0000 mg | ORAL_TABLET | Freq: Every day | ORAL | Status: DC

## 2013-08-15 NOTE — Progress Notes (Signed)
Subjective:    Patient ID: Joann [name], female    DOB: Oct 06, 1932, 80 y.o.   MRN: [id number]  HPI The patient is here for annual Medicare wellness examination and management of other chronic and acute problems.   The risk factors are reflected in the social history.  The roster of all physicians providing medical care to patient - is listed in the Snapshot section of the chart.  Activities of daily living:  The patient is 100% independent in all ADLs: dressing, toileting, feeding as well as independent mobility. Lives alone, husband recently died. No pets in home.  Home safety : The patient has smoke detectors in the home. They wear seatbelts.  There are locked firearms at home. There is no violence in the home.   There is no risks for hepatitis, STDs or HIV. There is no history of blood transfusion. They have no travel history to infectious disease endemic areas of the world.  The patient has seen their dentist in the last six month. (Dr. Smitty Cordsouloupas) They have seen their eye doctor in the last year. Followed for macular degeneration. (Dr. Allyne GeeSanders) No issues with hearing. They have deferred audiologic testing in the last year.   They do not  have excessive sun exposure. Discussed the need for sun protection: hats, long sleeves and use of sunscreen if there is significant sun exposure. (Dr. Orson AloeHenderson) Ortho - Dr. Myra Rudehristopher Smith  Diet: the importance of a healthy diet is discussed. They do have a healthy diet.  The benefits of regular aerobic exercise were discussed. She walks several times per week. Limited by arthritis.  Depression screen: there are no signs or vegative symptoms of depression- irritability, change in appetite, anhedonia, sadness/tearfullness. Symptoms improved with recent use of Sertraline.  Cognitive assessment: the patient manages all their financial and personal affairs and is actively engaged. They could relate day,date,year and events.  The following  portions of the patient's history were reviewed and updated as appropriate: allergies, current medications, past family history, past medical history,  past surgical history, past social history  and problem list.  Visual acuity was not assessed per patient preference since she has regular follow up with her ophthalmologist. Hearing and body mass index were assessed and reviewed.   During the course of the visit the patient was educated and counseled about appropriate screening and preventive services including : fall prevention , diabetes screening, nutrition counseling, colorectal cancer screening, and recommended immunizations.    Outpatient Encounter Prescriptions as of 08/15/2013  Medication Sig  . aspirin 81 MG tablet Take 81 mg by mouth daily.  Marland Kitchen. atorvastatin (LIPITOR) 10 MG tablet Take 1 tablet (10 mg total) by mouth daily.  . Cholecalciferol (VITAMIN D-3) 1000 UNITS CAPS Take 1 capsule by mouth daily.  Marland Kitchen. esomeprazole (NEXIUM) 40 MG capsule Take 1 capsule (40 mg total) by mouth daily before breakfast.  . etodolac (LODINE) 500 MG tablet Take 1 tablet (500 mg total) by mouth 2 (two) times daily.  Marland Kitchen. levothyroxine (SYNTHROID, LEVOTHROID) 25 MCG tablet Take 1 tablet (25 mcg total) by mouth daily.  . magnesium 30 MG tablet Take 30 mg by mouth daily.  Marland Kitchen. nystatin-triamcinolone (MYCOLOG II) cream Apply 1 application topically as needed.  . sertraline (ZOLOFT) 50 MG tablet Take 1 tablet (50 mg total) by mouth daily.  . Simethicone (GAS RELIEF 125 MAX ST PO) Take 1 tablet by mouth as needed.  . valsartan (DIOVAN) 160 MG tablet Take 1 tablet (160 mg total) by mouth daily.  BP 118/56  Pulse 74  Temp(Src) 98.1 F (36.7 C) (Oral)  Ht 5\' 1"  (1.549 m)  Wt 142 lb (64.411 kg)  BMI 26.84 kg/m2  SpO2 96%    Review of Systems  Constitutional: Negative for fever, chills, appetite change, fatigue and unexpected weight change.  HENT: Negative for congestion, ear pain, sinus pressure, sore throat,  trouble swallowing and voice change.   Eyes: Negative for visual disturbance.  Respiratory: Negative for cough, shortness of breath, wheezing and stridor.   Cardiovascular: Negative for chest pain, palpitations and leg swelling.  Gastrointestinal: Negative for nausea, vomiting, abdominal pain, diarrhea, constipation, blood in stool, abdominal distention and anal bleeding.  Genitourinary: Negative for dysuria and flank pain.  Musculoskeletal: Negative for arthralgias, gait problem, myalgias and neck pain.  Skin: Negative for color change and rash.  Neurological: Negative for dizziness and headaches.  Hematological: Negative for adenopathy. Does not bruise/bleed easily.  Psychiatric/Behavioral: Positive for dysphoric mood (intermittent after death of husband). Negative for suicidal ideas and sleep disturbance. The patient is not nervous/anxious.        Objective:   Physical Exam  Constitutional: She is oriented to person, place, and time. She appears well-developed and well-nourished. No distress.  HENT:  Head: Normocephalic and atraumatic.  Right Ear: External ear normal.  Left Ear: External ear normal.  Nose: Nose normal.  Mouth/Throat: Oropharynx is clear and moist. No oropharyngeal exudate.  Eyes: Conjunctivae are normal. Pupils are equal, round, and reactive to light. Right eye exhibits no discharge. Left eye exhibits no discharge. No scleral icterus.  Neck: Normal range of motion. Neck supple. No tracheal deviation present. No thyromegaly present.  Cardiovascular: Normal rate, regular rhythm, normal heart sounds and intact distal pulses.  Exam reveals no gallop and no friction rub.   No murmur heard. Pulmonary/Chest: Effort normal and breath sounds normal. No accessory muscle usage. Not tachypneic. No respiratory distress. She has no decreased breath sounds. She has no wheezes. She has no rhonchi. She has no rales. She exhibits no tenderness. Right breast exhibits no inverted nipple,  no mass, no nipple discharge, no skin change and no tenderness. Left breast exhibits no inverted nipple, no mass, no nipple discharge, no skin change and no tenderness. Breasts are symmetrical.  Abdominal: Soft. Bowel sounds are normal. She exhibits no distension and no mass. There is no tenderness. There is no rebound and no guarding.  Musculoskeletal: Normal range of motion. She exhibits no edema and no tenderness.  Lymphadenopathy:    She has no cervical adenopathy.  Neurological: She is alert and oriented to person, place, and time. No cranial nerve deficit. She exhibits normal muscle tone. Coordination normal.  Skin: Skin is warm and dry. No rash noted. She is not diaphoretic. No erythema. No pallor.  Psychiatric: She has a normal mood and affect. Her behavior is normal. Judgment and thought content normal.          Assessment & Plan:

## 2013-08-15 NOTE — Assessment & Plan Note (Signed)
Symptoms improved with sertraline. Will continue.

## 2013-08-15 NOTE — Assessment & Plan Note (Signed)
General medical exam including breast exam normal today. PAP and pelvic deferred given pt age and preference. Mammogram UTD. Colonoscopy UTD. Encouraged healthy diet and regular exercise. Immunizations are UTD.

## 2014-02-15 ENCOUNTER — Ambulatory Visit: Payer: Self-pay | Admitting: Internal Medicine

## 2014-02-21 ENCOUNTER — Ambulatory Visit (INDEPENDENT_AMBULATORY_CARE_PROVIDER_SITE_OTHER): Payer: Medicare HMO | Admitting: Internal Medicine

## 2014-02-21 ENCOUNTER — Encounter: Payer: Self-pay | Admitting: Internal Medicine

## 2014-02-21 VITALS — BP 112/64 | HR 63 | Temp 97.5°F | Ht 61.0 in | Wt 128.8 lb

## 2014-02-21 DIAGNOSIS — Z634 Disappearance and death of family member: Secondary | ICD-10-CM

## 2014-02-21 DIAGNOSIS — F4321 Adjustment disorder with depressed mood: Secondary | ICD-10-CM

## 2014-02-21 DIAGNOSIS — F4329 Adjustment disorder with other symptoms: Secondary | ICD-10-CM

## 2014-02-21 DIAGNOSIS — I10 Essential (primary) hypertension: Secondary | ICD-10-CM

## 2014-02-21 DIAGNOSIS — F4381 Prolonged grief disorder: Secondary | ICD-10-CM

## 2014-02-21 DIAGNOSIS — R634 Abnormal weight loss: Secondary | ICD-10-CM

## 2014-02-21 LAB — CBC WITH DIFFERENTIAL/PLATELET
BASOS ABS: 0.1 10*3/uL (ref 0.0–0.1)
BASOS PCT: 0.8 % (ref 0.0–3.0)
EOS ABS: 0.3 10*3/uL (ref 0.0–0.7)
Eosinophils Relative: 3.6 % (ref 0.0–5.0)
HEMATOCRIT: 41.4 % (ref 36.0–46.0)
HEMOGLOBIN: 14 g/dL (ref 12.0–15.0)
LYMPHS ABS: 1.6 10*3/uL (ref 0.7–4.0)
Lymphocytes Relative: 21.9 % (ref 12.0–46.0)
MCHC: 33.7 g/dL (ref 30.0–36.0)
MCV: 92.2 fl (ref 78.0–100.0)
MONO ABS: 0.6 10*3/uL (ref 0.1–1.0)
Monocytes Relative: 8.7 % (ref 3.0–12.0)
NEUTROS ABS: 4.6 10*3/uL (ref 1.4–7.7)
Neutrophils Relative %: 65 % (ref 43.0–77.0)
Platelets: 310 10*3/uL (ref 150.0–400.0)
RBC: 4.49 Mil/uL (ref 3.87–5.11)
RDW: 14.9 % (ref 11.5–15.5)
WBC: 7.1 10*3/uL (ref 4.0–10.5)

## 2014-02-21 LAB — COMPREHENSIVE METABOLIC PANEL
ALT: 19 U/L (ref 0–35)
AST: 17 U/L (ref 0–37)
Albumin: 3.9 g/dL (ref 3.5–5.2)
Alkaline Phosphatase: 73 U/L (ref 39–117)
BUN: 15 mg/dL (ref 6–23)
CALCIUM: 10.1 mg/dL (ref 8.4–10.5)
CO2: 27 mEq/L (ref 19–32)
CREATININE: 0.8 mg/dL (ref 0.4–1.2)
Chloride: 104 mEq/L (ref 96–112)
GFR: 73.56 mL/min (ref >60.00)
Glucose, Bld: 87 mg/dL (ref 70–99)
Potassium: 4.3 mEq/L (ref 3.5–5.1)
Sodium: 140 mEq/L (ref 135–145)
Total Bilirubin: 0.7 mg/dL (ref 0.2–1.2)
Total Protein: 6.6 g/dL (ref 6.0–8.3)

## 2014-02-21 LAB — POCT URINALYSIS DIPSTICK
Blood, UA: NEGATIVE
Glucose, UA: NEGATIVE
Ketones, UA: NEGATIVE
LEUKOCYTES UA: NEGATIVE
NITRITE UA: NEGATIVE
PH UA: 6
PROTEIN UA: NEGATIVE
Spec Grav, UA: 1.015
Urobilinogen, UA: 0.2

## 2014-02-21 LAB — VITAMIN B12: VITAMIN B 12: 299 pg/mL (ref 211–911)

## 2014-02-21 LAB — TSH: TSH: 1.52 u[IU]/mL (ref 0.35–4.50)

## 2014-02-21 MED ORDER — NYSTATIN-TRIAMCINOLONE 100000-0.1 UNIT/GM-% EX CREA: 1.0000 "application " | TOPICAL_CREAM | Freq: Two times a day (BID) | CUTANEOUS | Status: DC

## 2014-02-21 NOTE — Progress Notes (Signed)
Subjective:    Patient ID: Joann [name], female    DOB: October 31, 1932, 80 y.o.   MRN: [id number]  HPI 80YO female presents for follow up. Feeling well. No concerns today.  Appetite has decreased some and she is no longer preparing her meals. Her sister has been bringing meals to her. No change in bowel habits. No NVD. Coping better recently after death of her husband last year. Less symptoms of depressed mood.  Review of Systems  Constitutional: Positive for appetite change and unexpected weight change. Negative for fever, chills and fatigue.  Eyes: Negative for visual disturbance.  Respiratory: Negative for shortness of breath.   Cardiovascular: Negative for chest pain and leg swelling.  Gastrointestinal: Negative for nausea, vomiting, abdominal pain, diarrhea and constipation.  Skin: Negative for color change and rash.  Hematological: Negative for adenopathy. Does not bruise/bleed easily.  Psychiatric/Behavioral: Negative for suicidal ideas, sleep disturbance and dysphoric mood. The patient is not nervous/anxious.        Objective:    BP 112/64  Pulse 63  Temp(Src) 97.5 F (36.4 C) (Oral)  Ht 5\' 1"  (1.549 m)  Wt 128 lb 12 oz (58.401 kg)  BMI 24.34 kg/m2  SpO2 96% Physical Exam  Constitutional: She is oriented to person, place, and time. She appears well-developed and well-nourished. No distress.  HENT:  Head: Normocephalic and atraumatic.  Right Ear: External ear normal.  Left Ear: External ear normal.  Nose: Nose normal.  Mouth/Throat: Oropharynx is clear and moist. No oropharyngeal exudate.  Eyes: Conjunctivae and EOM are normal. Pupils are equal, round, and reactive to light. Right eye exhibits no discharge.  Neck: Normal range of motion. Neck supple. No thyromegaly present.  Cardiovascular: Normal rate, regular rhythm, normal heart sounds and intact distal pulses.  Exam reveals no gallop and no friction rub.   No murmur heard. Pulmonary/Chest: Effort normal. No  respiratory distress. She has no wheezes. She has no rales.  Abdominal: Soft. Bowel sounds are normal. She exhibits no distension and no mass. There is no tenderness. There is no rebound and no guarding.  Musculoskeletal: Normal range of motion. She exhibits no edema and no tenderness.  Lymphadenopathy:    She has no cervical adenopathy.  Neurological: She is alert and oriented to person, place, and time. No cranial nerve deficit. Coordination normal.  Skin: Skin is warm and dry. No rash noted. She is not diaphoretic. No erythema. No pallor.  Psychiatric: She has a normal mood and affect. Her behavior is normal. Judgment and thought content normal.          Assessment & Plan:   Problem List Items Addressed This Visit     Unprioritized   Complicated bereavement     Symptoms of depressed mood have improved compared with last visit. Offered support today. Will continue to monitor.    Hypertension      BP Readings from Last 3 Encounters:  02/21/14 112/64  08/15/13 118/56  07/10/13 110/60   BP well controlled on Valsartan. Will continue.    Loss of weight - Primary      Wt Readings from Last 3 Encounters:  02/21/14 128 lb 12 oz (58.401 kg)  08/15/13 142 lb (64.411 kg)  07/10/13 143 lb (64.864 kg)   16lb weight loss over 6 months. Labs today including CMP, CBC, TSH normal. Likely related to decreased appetite with recent bereavement. Will continue to follow.    Relevant Orders      CBC with Differential (Completed)  Comprehensive metabolic panel (Completed)      TSH (Completed)      B12 (Completed)      POCT Urinalysis Dipstick (Completed)       Return in about 4 weeks (around 03/21/2014) for Recheck.

## 2014-02-21 NOTE — Progress Notes (Signed)
Pre visit review using our clinic review tool, if applicable. No additional management support is needed unless otherwise documented below in the visit note. 

## 2014-02-21 NOTE — Assessment & Plan Note (Signed)
Symptoms of depressed mood have improved compared with last visit. Offered support today. Will continue to monitor.

## 2014-02-21 NOTE — Assessment & Plan Note (Signed)
Wt Readings from Last 3 Encounters:  02/21/14 128 lb 12 oz (58.401 kg)  08/15/13 142 lb (64.411 kg)  07/10/13 143 lb (64.864 kg)   16lb weight loss over 6 months. Labs today including CMP, CBC, TSH normal. Likely related to decreased appetite with recent bereavement. Will continue to follow.

## 2014-02-21 NOTE — Assessment & Plan Note (Signed)
BP Readings from Last 3 Encounters:  02/21/14 112/64  08/15/13 118/56  07/10/13 110/60   BP well controlled on Valsartan. Will continue.

## 2014-02-21 NOTE — Patient Instructions (Signed)
We will check labs today.  Follow up in 4 weeks.

## 2014-02-22 MED ORDER — CYANOCOBALAMIN 1000 MCG/ML IJ SOLN: INTRAMUSCULAR | Status: DC

## 2014-02-22 NOTE — Addendum Note (Signed)
Addended by: Ronna PolioWALKER, JENNIFER A on: 02/22/2014 01:50 PM   Modules accepted: Orders

## 2014-04-03 ENCOUNTER — Encounter: Payer: Self-pay | Admitting: Internal Medicine

## 2014-04-03 ENCOUNTER — Ambulatory Visit: Payer: Self-pay | Admitting: Internal Medicine

## 2014-04-03 ENCOUNTER — Ambulatory Visit (INDEPENDENT_AMBULATORY_CARE_PROVIDER_SITE_OTHER): Payer: Medicare HMO | Admitting: Internal Medicine

## 2014-04-03 VITALS — BP 118/60 | HR 60 | Ht 61.0 in | Wt 128.2 lb

## 2014-04-03 DIAGNOSIS — Z634 Disappearance and death of family member: Secondary | ICD-10-CM

## 2014-04-03 DIAGNOSIS — R634 Abnormal weight loss: Secondary | ICD-10-CM

## 2014-04-03 DIAGNOSIS — F4329 Adjustment disorder with other symptoms: Secondary | ICD-10-CM

## 2014-04-03 DIAGNOSIS — F4321 Adjustment disorder with depressed mood: Secondary | ICD-10-CM

## 2014-04-03 MED ORDER — ATORVASTATIN CALCIUM 10 MG PO TABS: 10.0000 mg | ORAL_TABLET | Freq: Every day | ORAL | Status: DC

## 2014-04-03 NOTE — Assessment & Plan Note (Signed)
Wt Readings from Last 3 Encounters:  04/03/14 128 lb 4 oz (58.174 kg)  02/21/14 128 lb 12 oz (58.401 kg)  08/15/13 142 lb (64.411 kg)   Weight stable today. Encouraged high calorie diet. Will continue to monitor.

## 2014-04-03 NOTE — Patient Instructions (Signed)
Continue current medications.  Follow up in 6 months or sooner as needed. 

## 2014-04-03 NOTE — Assessment & Plan Note (Signed)
Coping well off Sertraline. Will continue to monitor.

## 2014-04-03 NOTE — Progress Notes (Signed)
   Subjective:    Patient ID: Joann [name], female    DOB: 01-18-1933, 80 y.o.   MRN: [id number]  HPI 80YO female presents for follow up. Started B12 shots. No change in energy level noted with this.  She stopped Sertraline because felt that she did not need it.  Family members continue to bring her food and check on her frequently. She does not drive anymore because of macular degeneration. Family members help pay bills.  No new concerns today.  Review of Systems  Constitutional: Negative for fever, chills, appetite change, fatigue and unexpected weight change.  Eyes: Negative for visual disturbance.  Respiratory: Negative for shortness of breath.   Cardiovascular: Negative for chest pain and leg swelling.  Gastrointestinal: Negative for nausea, vomiting, abdominal pain, diarrhea and constipation.  Skin: Negative for color change and rash.  Hematological: Negative for adenopathy. Does not bruise/bleed easily.  Psychiatric/Behavioral: Negative for suicidal ideas, sleep disturbance, dysphoric mood and decreased concentration. The patient is not nervous/anxious.        Objective:    BP 118/60  Pulse 60  Ht  (1.549 m)  Wt 128 lb 4 oz (58.174 kg)  BMI 24.25 kg/m2  SpO2 97% Physical Exam  Constitutional: She is oriented to person, place, and time. She appears well-developed and well-nourished. No distress.  HENT:  Head: Normocephalic and atraumatic.  Right Ear: External ear normal.  Left Ear: External ear normal.  Nose: Nose normal.  Mouth/Throat: Oropharynx is clear and moist. No oropharyngeal exudate.  Eyes: Conjunctivae are normal. Pupils are equal, round, and reactive to light. Right eye exhibits no discharge. Left eye exhibits no discharge. No scleral icterus.  Neck: Normal range of motion. Neck supple. No tracheal deviation present. No thyromegaly present.  Cardiovascular: Normal rate, regular rhythm, normal heart sounds and intact distal pulses.  Exam reveals  no gallop and no friction rub.   No murmur heard. Pulmonary/Chest: Effort normal and breath sounds normal. No accessory muscle usage. Not tachypneic. No respiratory distress. She has no decreased breath sounds. She has no wheezes. She has no rhonchi. She has no rales. She exhibits no tenderness.  Musculoskeletal: Normal range of motion. She exhibits no edema and no tenderness.  Lymphadenopathy:    She has no cervical adenopathy.  Neurological: She is alert and oriented to person, place, and time. No cranial nerve deficit. She exhibits normal muscle tone. Coordination normal.  Skin: Skin is warm and dry. No rash noted. She is not diaphoretic. No erythema. No pallor.  Psychiatric: She has a normal mood and affect. Her behavior is normal. Judgment and thought content normal.          Assessment & Plan:   Problem List Items Addressed This Visit     Unprioritized   Complicated bereavement     Coping well off Sertraline. Will continue to monitor.    Loss of weight - Primary      Wt Readings from Last 3 Encounters:  04/03/14 128 lb 4 oz (58.174 kg)  02/21/14 128 lb 12 oz (58.401 kg)  08/15/13 142 lb (64.411 kg)   Weight stable today. Encouraged high calorie diet. Will continue to monitor.        Return in about 6 months (around 10/04/2014).

## 2014-04-03 NOTE — Progress Notes (Signed)
Pre visit review using our clinic review tool, if applicable. No additional management support is needed unless otherwise documented below in the visit note. 

## 2014-04-23 ENCOUNTER — Other Ambulatory Visit: Payer: Self-pay | Admitting: Internal Medicine

## 2014-05-01 ENCOUNTER — Other Ambulatory Visit: Payer: Self-pay | Admitting: Internal Medicine

## 2014-06-06 ENCOUNTER — Ambulatory Visit: Payer: Self-pay | Admitting: Internal Medicine

## 2014-06-06 LAB — HM MAMMOGRAPHY: HM Mammogram: NEGATIVE

## 2014-06-07 ENCOUNTER — Encounter: Payer: Self-pay | Admitting: *Deleted

## 2014-06-21 ENCOUNTER — Encounter: Payer: Self-pay | Admitting: Internal Medicine

## 2014-07-01 ENCOUNTER — Other Ambulatory Visit: Payer: Self-pay | Admitting: Internal Medicine

## 2014-07-31 ENCOUNTER — Other Ambulatory Visit: Payer: Self-pay | Admitting: Internal Medicine

## 2014-08-24 ENCOUNTER — Telehealth: Payer: Self-pay | Admitting: Internal Medicine

## 2014-08-24 NOTE — Telephone Encounter (Signed)
Pt states that she will talk to you about it at her appt next month

## 2014-08-24 NOTE — Telephone Encounter (Signed)
We will need to see her first to determine what we are referring for.

## 2014-08-24 NOTE — Telephone Encounter (Signed)
Patient needs referral to see dermatology , for facial rash,  Pt had made her appt @ [facility] for yesterday they would not see her because [facility]

## 2014-10-04 ENCOUNTER — Ambulatory Visit (INDEPENDENT_AMBULATORY_CARE_PROVIDER_SITE_OTHER): Payer: Commercial Managed Care - HMO | Admitting: Internal Medicine

## 2014-10-04 ENCOUNTER — Encounter: Payer: Self-pay | Admitting: Internal Medicine

## 2014-10-04 ENCOUNTER — Telehealth: Payer: Self-pay | Admitting: Internal Medicine

## 2014-10-04 VITALS — BP 116/67 | HR 62 | Temp 97.5°F | Ht 61.0 in | Wt 133.2 lb

## 2014-10-04 DIAGNOSIS — E538 Deficiency of other specified B group vitamins: Secondary | ICD-10-CM | POA: Insufficient documentation

## 2014-10-04 DIAGNOSIS — R441 Visual hallucinations: Secondary | ICD-10-CM

## 2014-10-04 DIAGNOSIS — I10 Essential (primary) hypertension: Secondary | ICD-10-CM

## 2014-10-04 DIAGNOSIS — L719 Rosacea, unspecified: Secondary | ICD-10-CM

## 2014-10-04 LAB — COMPREHENSIVE METABOLIC PANEL
ALBUMIN: 4.1 g/dL (ref 3.5–5.2)
ALK PHOS: 84 U/L (ref 39–117)
ALT: 16 U/L (ref 0–35)
AST: 17 U/L (ref 0–37)
BUN: 19 mg/dL (ref 6–23)
CALCIUM: 10.3 mg/dL (ref 8.4–10.5)
CHLORIDE: 104 meq/L (ref 96–112)
CO2: 28 mEq/L (ref 19–32)
Creatinine, Ser: 0.83 mg/dL (ref 0.40–1.20)
GFR: 70.39 mL/min (ref >60.00)
GLUCOSE: 83 mg/dL (ref 70–99)
POTASSIUM: 4.3 meq/L (ref 3.5–5.1)
SODIUM: 139 meq/L (ref 135–145)
TOTAL PROTEIN: 6.8 g/dL (ref 6.0–8.3)
Total Bilirubin: 0.5 mg/dL (ref 0.2–1.2)

## 2014-10-04 LAB — CBC WITH DIFFERENTIAL/PLATELET
BASOS ABS: 0.1 10*3/uL (ref 0.0–0.1)
Basophils Relative: 1.3 % (ref 0.0–3.0)
Eosinophils Absolute: 0.3 10*3/uL (ref 0.0–0.7)
Eosinophils Relative: 4.3 % (ref 0.0–5.0)
HCT: 42.3 % (ref 36.0–46.0)
HEMOGLOBIN: 14.4 g/dL (ref 12.0–15.0)
LYMPHS ABS: 1.6 10*3/uL (ref 0.7–4.0)
Lymphocytes Relative: 23.8 % (ref 12.0–46.0)
MCHC: 33.9 g/dL (ref 30.0–36.0)
MCV: 91.5 fl (ref 78.0–100.0)
MONOS PCT: 8.5 % (ref 3.0–12.0)
Monocytes Absolute: 0.6 10*3/uL (ref 0.1–1.0)
Neutro Abs: 4.1 10*3/uL (ref 1.4–7.7)
Neutrophils Relative %: 62.1 % (ref 43.0–77.0)
PLATELETS: 317 10*3/uL (ref 150.0–400.0)
RBC: 4.63 Mil/uL (ref 3.87–5.11)
RDW: 14.3 % (ref 11.5–15.5)
WBC: 6.6 10*3/uL (ref 4.0–10.5)

## 2014-10-04 LAB — VITAMIN B12: Vitamin B-12: 446 pg/mL (ref 211–911)

## 2014-10-04 MED ORDER — METRONIDAZOLE 0.75 % EX CREA: TOPICAL_CREAM | Freq: Two times a day (BID) | CUTANEOUS | Status: DC

## 2014-10-04 NOTE — Assessment & Plan Note (Signed)
Mild rosacea over face. Will start Metronidazole cream in effort to improve this and also appease her concerns about her skin.

## 2014-10-04 NOTE — Patient Instructions (Signed)
Start Metronidazole cream twice daily to face to help with rash.  Try using [facility] mouth wash.

## 2014-10-04 NOTE — Progress Notes (Signed)
Pre visit review using our clinic review tool, if applicable. No additional management support is needed unless otherwise documented below in the visit note. 

## 2014-10-04 NOTE — Assessment & Plan Note (Signed)
BP Readings from Last 3 Encounters:  10/04/14 116/67  04/03/14 118/60  02/21/14 112/64   BP well controlled. Continue current meds. Renal function with labs.

## 2014-10-04 NOTE — Assessment & Plan Note (Signed)
Recheck B12 with labs today.

## 2014-10-04 NOTE — Telephone Encounter (Signed)
Pt needs refill on Levothyroxin, Sertraline and Valsartar

## 2014-10-04 NOTE — Progress Notes (Signed)
Subjective:    Patient ID: Joann [name], female    DOB: 09-02-1932, 81 y.o.   MRN: [id number]  HPI  81YO female presents for followup.  Reports that recently gums have appeared "black." This seems to have resolved after cleaning her teeth, but recurs sometimes at night. Concerned that bugs may be in her skin or mouth. Also notes that she has had rash over lower face and has squeezed some areas on her face with removal of black or orange fluid. Notes that her collar was stained with orange.  Continues on B12 shots. Family has been concerned about confusion and hallucinations. They have been monitoring her 24/7.  Past medical, surgical, family and social history per today's encounter.  Wt Readings from Last 3 Encounters:  10/04/14 133 lb 4 oz (60.442 kg)  04/03/14 128 lb 4 oz (58.174 kg)  02/21/14 128 lb 12 oz (58.401 kg)    Review of Systems  Constitutional: Negative for fever, chills, appetite change, fatigue and unexpected weight change.  Eyes: Negative for visual disturbance.  Respiratory: Negative for shortness of breath.   Cardiovascular: Negative for chest pain and leg swelling.  Gastrointestinal: Negative for abdominal pain.  Skin: Positive for color change and rash.  Hematological: Negative for adenopathy. Does not bruise/bleed easily.  Psychiatric/Behavioral: Positive for hallucinations. Negative for dysphoric mood. The patient is not nervous/anxious.        Objective:    BP 116/67 mmHg  Pulse 62  Temp(Src) 97.5 F (36.4 C) (Oral)  Ht  (1.549 m)  Wt 133 lb 4 oz (60.442 kg)  BMI 25.19 kg/m2  SpO2 97% Physical Exam  Constitutional: She is oriented to person, place, and time. She appears well-developed and well-nourished. No distress.  HENT:  Head: Normocephalic and atraumatic.  Right Ear: External ear normal.  Left Ear: External ear normal.  Nose: Nose normal.  Mouth/Throat: Oropharynx is clear and moist. No oropharyngeal exudate.  Eyes:  Conjunctivae are normal. Pupils are equal, round, and reactive to light. Right eye exhibits no discharge. Left eye exhibits no discharge. No scleral icterus.  Neck: Normal range of motion. Neck supple. No tracheal deviation present. No thyromegaly present.  Cardiovascular: Normal rate, regular rhythm, normal heart sounds and intact distal pulses.  Exam reveals no gallop and no friction rub.   No murmur heard. Pulmonary/Chest: Effort normal and breath sounds normal. No respiratory distress. She has no wheezes. She has no rales. She exhibits no tenderness.  Musculoskeletal: Normal range of motion. She exhibits no edema or tenderness.  Lymphadenopathy:    She has no cervical adenopathy.  Neurological: She is alert and oriented to person, place, and time. No cranial nerve deficit. She exhibits normal muscle tone. Coordination normal.  Skin: Skin is warm and dry. No rash noted. She is not diaphoretic. No erythema. No pallor.  Psychiatric: She has a normal mood and affect. Her speech is normal. Thought content normal. She is actively hallucinating. Cognition and memory are impaired. She expresses impulsivity and inappropriate judgment.          Assessment & Plan:   Problem List Items Addressed This Visit      Unprioritized   B12 deficiency    Recheck B12 with labs today.      Relevant Orders   CBC with Differential/Platelet   B12   Hallucination, visual    Hallucinating and seeing orange/black fluid from skin, bugs in skin and black gums. Chronic and ongoing memory loss and hallucinations. Family is aware  of this and they have been monitoring her 24/7. Will recheck B12 and TSH with labs today. May need to consider adding Seroquel in the future.      Hypertension    BP Readings from Last 3 Encounters:  10/04/14 116/67  04/03/14 118/60  02/21/14 112/64   BP well controlled. Continue current meds. Renal function with labs.      Relevant Orders   Comprehensive metabolic panel    Rosacea - Primary    Mild rosacea over face. Will start Metronidazole cream in effort to improve this and also appease her concerns about her skin.      Relevant Medications   metroNIDAZOLE (METROCREAM) 0.75 % cream       Return in about 4 weeks (around 11/01/2014) for Recheck.

## 2014-10-04 NOTE — Assessment & Plan Note (Signed)
Hallucinating and seeing orange/black fluid from skin, bugs in skin and black gums. Chronic and ongoing memory loss and hallucinations. Family is aware of this and they have been monitoring her 24/7. Will recheck B12 and TSH with labs today. May need to consider adding Seroquel in the future.

## 2014-10-05 ENCOUNTER — Other Ambulatory Visit: Payer: Self-pay | Admitting: *Deleted

## 2014-10-05 ENCOUNTER — Telehealth: Payer: Self-pay | Admitting: Internal Medicine

## 2014-10-05 ENCOUNTER — Encounter: Payer: Self-pay | Admitting: *Deleted

## 2014-10-05 MED ORDER — LEVOTHYROXINE SODIUM 25 MCG PO TABS: 25.0000 ug | ORAL_TABLET | Freq: Every day | ORAL | Status: DC

## 2014-10-05 MED ORDER — SERTRALINE HCL 50 MG PO TABS: 50.0000 mg | ORAL_TABLET | Freq: Every day | ORAL | Status: DC

## 2014-10-05 MED ORDER — VALSARTAN 160 MG PO TABS: 160.0000 mg | ORAL_TABLET | Freq: Every day | ORAL | Status: DC

## 2014-10-05 NOTE — Telephone Encounter (Signed)
emmi mailed  °

## 2014-10-25 ENCOUNTER — Telehealth: Payer: Self-pay

## 2014-10-25 NOTE — Telephone Encounter (Signed)
The pt needs a humana ref to  [facility] - Dr.David Gwen PoundsKowalski  For rash -( icd 10 - R21)  Office callback - [phone]

## 2014-11-15 ENCOUNTER — Ambulatory Visit: Payer: Self-pay | Admitting: Internal Medicine

## 2014-12-11 ENCOUNTER — Encounter: Payer: Self-pay | Admitting: Internal Medicine

## 2014-12-11 ENCOUNTER — Ambulatory Visit (INDEPENDENT_AMBULATORY_CARE_PROVIDER_SITE_OTHER): Payer: Commercial Managed Care - HMO | Admitting: Internal Medicine

## 2014-12-11 VITALS — BP 98/54 | HR 71 | Temp 97.6°F | Ht 61.0 in | Wt 128.1 lb

## 2014-12-11 DIAGNOSIS — F0391 Unspecified dementia with behavioral disturbance: Secondary | ICD-10-CM | POA: Diagnosis not present

## 2014-12-11 DIAGNOSIS — F03918 Unspecified dementia, unspecified severity, with other behavioral disturbance: Secondary | ICD-10-CM

## 2014-12-11 DIAGNOSIS — R441 Visual hallucinations: Secondary | ICD-10-CM

## 2014-12-11 MED ORDER — QUETIAPINE FUMARATE 25 MG PO TABS: 25.0000 mg | ORAL_TABLET | Freq: Every day | ORAL | Status: DC

## 2014-12-11 NOTE — Assessment & Plan Note (Signed)
Will start Seroquel to help with visual hallucinations. Follow up in 4 weeks.

## 2014-12-11 NOTE — Assessment & Plan Note (Signed)
Persistent visual hallucinations. Will start Seroquel 25mg  at bedtime. Discussed importance of 24/7 supervision with her daughter. All recent labs were normal. Will plan to set up referral to psychiatry, but she has been unwilling to agree to this so far. Follow up 4 weeks and by email in 1 week with her daughter.

## 2014-12-11 NOTE — Progress Notes (Signed)
Subjective:    Patient ID: Joann [name], female    DOB: 1933-02-28, 81 y.o.   MRN: [id number]  HPI  81YO female with dementia and recent hallucinations presents for follow up.  She continues to have hallucinations, describing "black spots" on her face and "yellow" liquid and water "squirting out of her face. She was seen by dermatology and also had used topical antibiotic ointment however she did not feel that her symptoms improved. She has been brushing and wiping her face to try to remove these spots.  She is currently living at home and has supervision with her children until about 11pm at night. She is alone until the morning.    Past medical, surgical, family and social history per today's encounter.  Review of Systems  Constitutional: Negative for fever, chills, appetite change, fatigue and unexpected weight change.  Eyes: Negative for visual disturbance.  Respiratory: Negative for shortness of breath.   Cardiovascular: Negative for chest pain and leg swelling.  Gastrointestinal: Negative for abdominal pain.  Skin: Negative for color change and rash.  Hematological: Negative for adenopathy. Does not bruise/bleed easily.  Psychiatric/Behavioral: Positive for hallucinations, behavioral problems, confusion, sleep disturbance, decreased concentration and agitation. Negative for dysphoric mood. The patient is nervous/anxious.        Objective:    BP 98/54 mmHg  Pulse 71  Temp(Src) 97.6 F (36.4 C) (Oral)  Ht  (1.549 m)  Wt 128 lb 2 oz (58.117 kg)  BMI 24.22 kg/m2  SpO2 99% Physical Exam  Constitutional: She is oriented to person, place, and time. She appears well-developed and well-nourished. No distress.  HENT:  Head: Normocephalic and atraumatic.  Right Ear: External ear normal.  Left Ear: External ear normal.  Nose: Nose normal.  Mouth/Throat: Oropharynx is clear and moist.  Eyes: Conjunctivae are normal. Pupils are equal, round, and reactive to light.  Right eye exhibits no discharge. Left eye exhibits no discharge. No scleral icterus.  Neck: Normal range of motion. Neck supple. No tracheal deviation present. No thyromegaly present.  Cardiovascular: Normal rate, regular rhythm, normal heart sounds and intact distal pulses.  Exam reveals no gallop and no friction rub.   No murmur heard. Pulmonary/Chest: Effort normal and breath sounds normal. No respiratory distress. She has no wheezes. She has no rales. She exhibits no tenderness.  Musculoskeletal: Normal range of motion. She exhibits no edema or tenderness.  Lymphadenopathy:    She has no cervical adenopathy.  Neurological: She is alert and oriented to person, place, and time. No cranial nerve deficit. She exhibits normal muscle tone. Coordination normal.  Skin: Skin is warm and dry. No rash noted. She is not diaphoretic. No erythema. No pallor.  Psychiatric: She has a normal mood and affect. Her speech is normal. Thought content normal. She is agitated and actively hallucinating. Cognition and memory are impaired. She expresses impulsivity and inappropriate judgment. She exhibits abnormal recent memory.          Assessment & Plan:   Problem List Items Addressed This Visit      Unprioritized   Dementia with behavioral disturbance    Will start Seroquel to help with visual hallucinations. Follow up in 4 weeks.       Relevant Medications   QUEtiapine (SEROQUEL) 25 MG tablet   Hallucination, visual - Primary    Persistent visual hallucinations. Will start Seroquel  at bedtime. Discussed importance of 24/7 supervision with her daughter. All recent labs were normal. Will plan to set up  referral to psychiatry, but she has been unwilling to agree to this so far. Follow up 4 weeks and by email in 1 week with her daughter.      Relevant Medications   QUEtiapine (SEROQUEL) 25 MG tablet   Other Relevant Orders   Ambulatory referral to Home Health       Return in about 4 weeks  (around 01/08/2015) for Recheck.

## 2014-12-11 NOTE — Patient Instructions (Signed)
Start using Cetaphil soap to wash face.  Start Seroquel 25mg  at bedtime.  Follow up in 4 weeks.

## 2014-12-11 NOTE — Progress Notes (Signed)
Pre visit review using our clinic review tool, if applicable. No additional management support is needed unless otherwise documented below in the visit note. 

## 2014-12-18 ENCOUNTER — Telehealth: Payer: Self-pay

## 2014-12-18 NOTE — Telephone Encounter (Signed)
The pt's daughter called and is hoping to up the dosage of the patient's new medication.   Daughter [name]( Lisa ) callback - [phone]

## 2014-12-18 NOTE — Telephone Encounter (Signed)
Left vm to return my call

## 2014-12-18 NOTE — Telephone Encounter (Signed)
pts daughter, Rosanne GuttingLisa Sutton, called back states the Seroquel 25 mg is not effective, states pt is still hallucinating.  Requesting an increase of medication.  Please advise

## 2014-12-18 NOTE — Telephone Encounter (Signed)
OK. Fine to increase Seroquel to 50mg  daily. I would recommend evaluation with psychiatry. We can place this referral if family is willing to take pt.

## 2014-12-19 NOTE — Telephone Encounter (Signed)
Notified [name], she says she is willing to take her mother to psychiatry appts but pt has not been agreeable to seeing psychiatry in the past, [name] says Dr Dan HumphreysWalker can talk with pt at her next appt about it.

## 2014-12-25 ENCOUNTER — Telehealth: Payer: Self-pay | Admitting: *Deleted

## 2014-12-25 ENCOUNTER — Other Ambulatory Visit: Payer: Self-pay | Admitting: *Deleted

## 2014-12-25 DIAGNOSIS — R441 Visual hallucinations: Secondary | ICD-10-CM

## 2014-12-25 MED ORDER — QUETIAPINE FUMARATE 25 MG PO TABS: 25.0000 mg | ORAL_TABLET | Freq: Two times a day (BID) | ORAL | Status: DC

## 2014-12-25 NOTE — Telephone Encounter (Signed)
If Seroquel 50mg  is not helping with hallucinations, then she needs to be evaluated by a psychiatrist

## 2014-12-25 NOTE — Telephone Encounter (Signed)
See below request

## 2014-12-25 NOTE — Telephone Encounter (Signed)
Notified pt's daughter, she states that pt is not willing to see psychiatrist, they have talked with the pt before about going and she gives them a hard time.  Pt's daughter states that they will continue the medication and discuss options at appt on 01/17/15.  Requested refill of seroquel since she has been taking 50 mg now, sent new rx

## 2015-01-17 ENCOUNTER — Encounter: Payer: Self-pay | Admitting: Internal Medicine

## 2015-01-17 ENCOUNTER — Ambulatory Visit (INDEPENDENT_AMBULATORY_CARE_PROVIDER_SITE_OTHER): Payer: Commercial Managed Care - HMO | Admitting: Internal Medicine

## 2015-01-17 VITALS — BP 118/63 | HR 54 | Temp 97.5°F | Ht 61.0 in | Wt 126.1 lb

## 2015-01-17 DIAGNOSIS — F0391 Unspecified dementia with behavioral disturbance: Secondary | ICD-10-CM | POA: Diagnosis not present

## 2015-01-17 DIAGNOSIS — F03918 Unspecified dementia, unspecified severity, with other behavioral disturbance: Secondary | ICD-10-CM

## 2015-01-17 MED ORDER — MIRTAZAPINE 15 MG PO TABS: 15.0000 mg | ORAL_TABLET | Freq: Every day | ORAL | Status: DC

## 2015-01-17 NOTE — Patient Instructions (Signed)
Stop Seroquel  Start Remeron 15mg  at bedtime to help with sleep.

## 2015-01-17 NOTE — Assessment & Plan Note (Signed)
No improvement with addition of Seroquel to help with hallucinations. Recommended evaluation by psychiatry to help manage hallucinations, however family/pt declines. Will try adding Remeron to help with sleep at night. Follow up by phone next week and then in 4 weeks.

## 2015-01-17 NOTE — Progress Notes (Signed)
   Subjective:    Patient ID: Joann [name], female    DOB: May 01, 1933, 81 y.o.   MRN: [id number]  HPI  81YO female presents for follow up with her daughter.  No change in visual hallucinations with addition of Seroquel. Continues to see "black" spiders and liquid coming from her face and orange liquid "squirting" from her face.  Wt Readings from Last 3 Encounters:  01/17/15 126 lb 2 oz (57.21 kg)  12/11/14 128 lb 2 oz (58.117 kg)  10/04/14 133 lb 4 oz (60.442 kg)    Past medical, surgical, family and social history per today's encounter.  Review of Systems  Unable to perform ROS: Dementia       Objective:    BP 118/63 mmHg  Pulse 54  Temp(Src) 97.5 F (36.4 C) (Oral)  Ht 5\' 1"  (1.549 m)  Wt 126 lb 2 oz (57.21 kg)  BMI 23.84 kg/m2  SpO2 100% Physical Exam  Constitutional: She is oriented to person, place, and time. She appears well-developed and well-nourished. No distress.  HENT:  Head: Normocephalic and atraumatic.  Right Ear: External ear normal.  Left Ear: External ear normal.  Nose: Nose normal.  Mouth/Throat: Oropharynx is clear and moist. No oropharyngeal exudate.  Eyes: Conjunctivae are normal. Pupils are equal, round, and reactive to light. Right eye exhibits no discharge. Left eye exhibits no discharge. No scleral icterus.  Neck: Normal range of motion. Neck supple. No tracheal deviation present. No thyromegaly present.  Cardiovascular: Normal rate, regular rhythm, normal heart sounds and intact distal pulses.  Exam reveals no gallop and no friction rub.   No murmur heard. Pulmonary/Chest: Effort normal and breath sounds normal. No respiratory distress. She has no wheezes. She has no rales. She exhibits no tenderness.  Musculoskeletal: Normal range of motion. She exhibits no edema or tenderness.  Lymphadenopathy:    She has no cervical adenopathy.  Neurological: She is alert and oriented to person, place, and time. No cranial nerve deficit. She exhibits  normal muscle tone. Coordination normal.  Skin: Skin is warm and dry. No rash noted. She is not diaphoretic. No erythema. No pallor.  Psychiatric: She has a normal mood and affect. Her speech is normal. She is actively hallucinating. Thought content is paranoid and delusional. Cognition and memory are impaired. She expresses impulsivity and inappropriate judgment.          Assessment & Plan:  Over of which >50% spent in face-to-face contact with patient discussing plan of care  Problem List Items Addressed This Visit      Unprioritized   Dementia with behavioral disturbance - Primary   Relevant Medications   mirtazapine (REMERON) 15 MG tablet       Return in about 4 weeks (around 02/14/2015).

## 2015-01-17 NOTE — Progress Notes (Signed)
Pre visit review using our clinic review tool, if applicable. No additional management support is needed unless otherwise documented below in the visit note. 

## 2015-03-18 ENCOUNTER — Ambulatory Visit: Payer: Self-pay | Admitting: Internal Medicine

## 2015-03-21 ENCOUNTER — Encounter: Payer: Self-pay | Admitting: Internal Medicine

## 2015-03-21 ENCOUNTER — Ambulatory Visit (INDEPENDENT_AMBULATORY_CARE_PROVIDER_SITE_OTHER): Payer: Commercial Managed Care - HMO | Admitting: Internal Medicine

## 2015-03-21 VITALS — BP 102/60 | HR 58 | Temp 97.4°F | Wt 125.2 lb

## 2015-03-21 DIAGNOSIS — F03918 Unspecified dementia, unspecified severity, with other behavioral disturbance: Secondary | ICD-10-CM

## 2015-03-21 DIAGNOSIS — F0391 Unspecified dementia with behavioral disturbance: Secondary | ICD-10-CM | POA: Diagnosis not present

## 2015-03-21 LAB — POCT URINALYSIS DIPSTICK
GLUCOSE UA: NEGATIVE
Nitrite, UA: NEGATIVE
PROTEIN UA: NEGATIVE
RBC UA: NEGATIVE
Spec Grav, UA: 1.02
UROBILINOGEN UA: 0.2
pH, UA: 5.5

## 2015-03-21 MED ORDER — QUETIAPINE FUMARATE 25 MG PO TABS
75.0000 mg | ORAL_TABLET | Freq: Every day | ORAL | Status: DC
Start: 2015-03-21 — End: 2015-07-26

## 2015-03-21 MED ORDER — SERTRALINE HCL 50 MG PO TABS: 50.0000 mg | ORAL_TABLET | Freq: Every day | ORAL | Status: DC

## 2015-03-21 NOTE — Progress Notes (Signed)
Subjective:    Patient ID: Joann [name], female    DOB: 1932/11/29, 81 y.o.   MRN: [id number]  HPI  81YO female presents for follow up. She is with her daughter, Joann [name], who provides the history.  Last visit added Remeron to help with sleep at night. Pt had increased hallucinations with this. Called police on 5 occasions. Daughter stopped medication. Changed back to Seroquel  at bedtime. Pt continues to have hallucinations that "orange fluid" is coming out of her face and worms are in her mouth. Has been scraping her skin. Up all night. Sleeps during day.    Past Medical History  Diagnosis Date  . Arthritis   . Chicken pox   . GERD (gastroesophageal reflux disease)   . Hypertension   . Hyperlipidemia   . Colon polyp   . Thyroid disease   . Macular degeneration     Dr. Allyne Gee in [place]   Family History  Problem Relation Age of Onset  . Heart disease Mother   . Hypertension Mother   . Parkinsonism Mother   . Heart disease Father   . Hypertension Father   . Cancer Father     bone   Past Surgical History  Procedure Laterality Date  . Appendectomy  1942  . Abdominal hysterectomy  1971  . Knee dislocation surgery     Social History   Social History  . Marital Status: Married    Spouse Name: N/A  . Number of Children: N/A  . Years of Education: N/A   Social History Main Topics  . Smoking status: Former Games developer  . Smokeless tobacco: None  . Alcohol Use: Yes  . Drug Use: No  . Sexual Activity: Not Asked   Other Topics Concern  . None   Social History Narrative   Lives in [place].      Retired - Worked as Mining engineer  Unable to perform ROS: Dementia       Objective:    BP 102/60 mmHg  Pulse 58  Temp(Src) 97.4 F (36.3 C) (Oral)  Wt 125 lb 3.2 oz (56.79 kg)  SpO2 97% Physical Exam  Constitutional: She is oriented to person, place, and time. She appears well-developed and well-nourished. No distress.  HENT:    Head: Normocephalic and atraumatic.  Right Ear: External ear normal.  Left Ear: External ear normal.  Nose: Nose normal.  Mouth/Throat: Oropharynx is clear and moist. No oropharyngeal exudate.  Eyes: Conjunctivae are normal. Pupils are equal, round, and reactive to light. Right eye exhibits no discharge. Left eye exhibits no discharge. No scleral icterus.  Neck: Normal range of motion. Neck supple. No tracheal deviation present. No thyromegaly present.  Cardiovascular: Normal rate, regular rhythm, normal heart sounds and intact distal pulses.  Exam reveals no gallop and no friction rub.   No murmur heard. Pulmonary/Chest: Effort normal and breath sounds normal. No respiratory distress. She has no wheezes. She has no rales. She exhibits no tenderness.  Musculoskeletal: Normal range of motion. She exhibits no edema or tenderness.  Lymphadenopathy:    She has no cervical adenopathy.  Neurological: She is alert and oriented to person, place, and time. No cranial nerve deficit. She exhibits normal muscle tone. Coordination normal.  Skin: Skin is warm and dry. No rash noted. She is not diaphoretic. No erythema. No pallor.  Psychiatric: She has a normal mood and affect. Her speech is normal and behavior is normal. Thought content is delusional. Cognition and  memory are impaired. She expresses impulsivity and inappropriate judgment.          Assessment & Plan:  Over of which >50% spent in face-to-face contact with patient discussing plan of care  Problem List Items Addressed This Visit      Unprioritized   Dementia with behavioral disturbance - Primary    Symptoms of visual and auditory hallucinations persistent. Will increase Seroquel to 75mg  at bedtime. Will set up psychiatry evaluation. Encouraged daughter to meet with other family members and discuss long term inpatient care with psychiatric care.      Relevant Medications   QUEtiapine (SEROQUEL) 25 MG tablet   sertraline  (ZOLOFT) 50 MG tablet   Other Relevant Orders   Ambulatory referral to Psychiatry   POCT Urinalysis Dipstick (Completed)   Urine culture       Return in about 3 months (around 06/21/2015) for Recheck.

## 2015-03-21 NOTE — Patient Instructions (Signed)
Increase Seroquel to  at bedtime.  We will set up evaluation with Dr. Maryruth Bun.

## 2015-03-21 NOTE — Progress Notes (Signed)
Pre visit review using our clinic review tool, if applicable. No additional management support is needed unless otherwise documented below in the visit note. 

## 2015-03-21 NOTE — Assessment & Plan Note (Signed)
Symptoms of visual and auditory hallucinations persistent. Will increase Seroquel to  at bedtime. Will set up psychiatry evaluation. Encouraged daughter to meet with other family members and discuss long term inpatient care with psychiatric care.

## 2015-03-23 LAB — URINE CULTURE
Colony Count: NO GROWTH
Organism ID, Bacteria: NO GROWTH

## 2015-04-16 ENCOUNTER — Telehealth: Payer: Self-pay

## 2015-04-26 ENCOUNTER — Telehealth: Payer: Self-pay | Admitting: Internal Medicine

## 2015-04-26 NOTE — Telephone Encounter (Signed)
Joni Reining called [facility] regarding pt stating that she does not psy assistance. Joni Reining just wanted to inform Dr Dan Humphreys what the pt said. Thank You!

## 2015-04-30 ENCOUNTER — Encounter (INDEPENDENT_AMBULATORY_CARE_PROVIDER_SITE_OTHER): Payer: Self-pay

## 2015-05-27 ENCOUNTER — Other Ambulatory Visit: Payer: Self-pay | Admitting: Internal Medicine

## 2015-06-27 ENCOUNTER — Ambulatory Visit: Payer: Self-pay | Admitting: Internal Medicine

## 2015-06-28 ENCOUNTER — Ambulatory Visit: Payer: Self-pay | Admitting: Internal Medicine

## 2015-07-08 ENCOUNTER — Ambulatory Visit: Payer: Self-pay | Admitting: Internal Medicine

## 2015-07-26 ENCOUNTER — Other Ambulatory Visit: Payer: Self-pay | Admitting: Internal Medicine

## 2015-07-31 ENCOUNTER — Ambulatory Visit: Payer: Self-pay | Admitting: Internal Medicine

## 2015-08-19 ENCOUNTER — Ambulatory Visit: Payer: Self-pay | Admitting: Internal Medicine

## 2015-09-15 ENCOUNTER — Other Ambulatory Visit: Payer: Self-pay | Admitting: Internal Medicine

## 2015-09-30 ENCOUNTER — Other Ambulatory Visit: Payer: Self-pay | Admitting: Internal Medicine

## 2015-12-17 ENCOUNTER — Other Ambulatory Visit: Payer: Self-pay | Admitting: Internal Medicine

## 2015-12-19 NOTE — Telephone Encounter (Signed)
Last seen on 03/11/2015, has cancelled numerous times since.  Please advise refill. thanks

## 2015-12-24 ENCOUNTER — Other Ambulatory Visit: Payer: Self-pay | Admitting: Internal Medicine

## 2016-01-01 ENCOUNTER — Other Ambulatory Visit: Payer: Self-pay | Admitting: Internal Medicine

## 2016-01-17 ENCOUNTER — Other Ambulatory Visit: Payer: Self-pay | Admitting: Internal Medicine

## 2016-02-05 ENCOUNTER — Other Ambulatory Visit: Payer: Self-pay | Admitting: Internal Medicine

## 2016-02-05 NOTE — Telephone Encounter (Signed)
Refill request for SEROQUEL, last seen [id number], last filled [id number].  Please advise.

## 2016-02-28 ENCOUNTER — Telehealth: Payer: Self-pay | Admitting: Internal Medicine

## 2016-02-28 NOTE — Telephone Encounter (Signed)
Pt daughter Joann [name] called stating that her mother needs a refill on QUEtiapine (SEROQUEL) 25 MG tablet.Nicolette Bang. [facility] [address] - [place], [place] - [phone] [address].Marland Kitchen. Please advise

## 2016-02-28 NOTE — Telephone Encounter (Signed)
Spoke to daughter [name]. She confirmed refill on file with pharmacy..Marland Kitchen

## 2016-03-31 ENCOUNTER — Other Ambulatory Visit: Payer: Self-pay | Admitting: Family Medicine

## 2016-03-31 MED ORDER — SERTRALINE HCL 50 MG PO TABS: 50.0000 mg | ORAL_TABLET | Freq: Every day | ORAL | 1 refills | Status: DC

## 2016-03-31 MED ORDER — ATORVASTATIN CALCIUM 10 MG PO TABS: 10.0000 mg | ORAL_TABLET | Freq: Every day | ORAL | 1 refills | Status: DC

## 2016-03-31 MED ORDER — QUETIAPINE FUMARATE 25 MG PO TABS: 75.0000 mg | ORAL_TABLET | Freq: Every day | ORAL | 0 refills | Status: DC

## 2016-03-31 NOTE — Telephone Encounter (Signed)
Medication refilled 09/16/15. Patient last seen 03/2015. Please advise?

## 2016-03-31 NOTE — Telephone Encounter (Signed)
zoloft was refilled 01/17/16. Last seen 03/2015. Please advise? No future appointment.

## 2016-03-31 NOTE — Telephone Encounter (Signed)
quetiapine was refilled 02/05/16. Last seen 03/2015. Please advise?

## 2016-06-03 ENCOUNTER — Other Ambulatory Visit: Payer: Self-pay | Admitting: Family Medicine

## 2016-06-04 NOTE — Telephone Encounter (Signed)
Pt has not seen Dr. Dan HumphreysWalker since last year and they switched to a new provider in January of this year.

## 2016-06-04 NOTE — Telephone Encounter (Signed)
Please call and schedule appointment with pt to be seen by new PCP.

## 2016-11-11 DIAGNOSIS — F0391 Unspecified dementia with behavioral disturbance: Secondary | ICD-10-CM | POA: Diagnosis not present

## 2016-11-11 DIAGNOSIS — I10 Essential (primary) hypertension: Secondary | ICD-10-CM | POA: Diagnosis not present

## 2016-11-11 DIAGNOSIS — E782 Mixed hyperlipidemia: Secondary | ICD-10-CM | POA: Diagnosis not present

## 2016-11-11 DIAGNOSIS — E039 Hypothyroidism, unspecified: Secondary | ICD-10-CM | POA: Diagnosis not present

## 2016-11-11 DIAGNOSIS — R441 Visual hallucinations: Secondary | ICD-10-CM | POA: Diagnosis not present

## 2017-01-21 ENCOUNTER — Encounter: Payer: Self-pay | Admitting: *Deleted

## 2017-01-21 ENCOUNTER — Emergency Department: Payer: PPO

## 2017-01-21 DIAGNOSIS — M11261 Other chondrocalcinosis, right knee: Secondary | ICD-10-CM | POA: Diagnosis not present

## 2017-01-21 DIAGNOSIS — Y999 Unspecified external cause status: Secondary | ICD-10-CM | POA: Diagnosis not present

## 2017-01-21 DIAGNOSIS — I10 Essential (primary) hypertension: Secondary | ICD-10-CM | POA: Insufficient documentation

## 2017-01-21 DIAGNOSIS — S8991XA Unspecified injury of right lower leg, initial encounter: Secondary | ICD-10-CM | POA: Diagnosis not present

## 2017-01-21 DIAGNOSIS — Y92007 Garden or yard of unspecified non-institutional (private) residence as the place of occurrence of the external cause: Secondary | ICD-10-CM | POA: Insufficient documentation

## 2017-01-21 DIAGNOSIS — F17210 Nicotine dependence, cigarettes, uncomplicated: Secondary | ICD-10-CM | POA: Diagnosis not present

## 2017-01-21 DIAGNOSIS — M25461 Effusion, right knee: Secondary | ICD-10-CM | POA: Diagnosis not present

## 2017-01-21 DIAGNOSIS — Z79899 Other long term (current) drug therapy: Secondary | ICD-10-CM | POA: Insufficient documentation

## 2017-01-21 DIAGNOSIS — W010XXA Fall on same level from slipping, tripping and stumbling without subsequent striking against object, initial encounter: Secondary | ICD-10-CM | POA: Diagnosis not present

## 2017-01-21 DIAGNOSIS — Y9301 Activity, walking, marching and hiking: Secondary | ICD-10-CM | POA: Diagnosis not present

## 2017-01-21 NOTE — ED Triage Notes (Signed)
Pt states she fell yesterday, injuring R knee. Pt states she was walking in the yard and tripped over a root. Pt states worsening pain and decreased mobility, unable to bear weight on R leg. Pt states no relief w/ OTC ibuprofen and less pain w/ rest, but still unable to get to the bathroom w/o assistance. Pt does not have a bedside commode to use while recovering. Pt has not seen a doctor regarding recent injury. Pt lives alone but has family that can help.

## 2017-01-22 ENCOUNTER — Emergency Department
Admission: EM | Admit: 2017-01-22 | Discharge: 2017-01-22 | Disposition: A | Discharge status: Home / Self Care | Payer: PPO | Attending: Emergency Medicine | Admitting: Emergency Medicine

## 2017-01-22 DIAGNOSIS — S8991XA Unspecified injury of right lower leg, initial encounter: Secondary | ICD-10-CM

## 2017-01-22 MED ORDER — OXYCODONE-ACETAMINOPHEN 5-325 MG PO TABS: 1.0000 | ORAL_TABLET | ORAL | 0 refills | Status: DC | PRN

## 2017-01-22 NOTE — ED Provider Notes (Signed)
[facility] Emergency Department Provider Note   First MD Initiated Contact with Patient 01/22/17 0145     (approximate)  I have reviewed the triage vital signs and the nursing notes.   HISTORY  Chief Complaint Knee Pain   HPI Joann [name] is a 83 y.o. female presents to the emergency department with a 6 out of 10 right knee pain status post accidental trip and fall while walking in her yard. She states that the pain is worse with any movement of the knee and with bearing weight onto the right leg. Patient states that she took ibuprofen before presentation without any improvement of pain. Denies any head injury during the fall.  Past Medical History:  Diagnosis Date  . Arthritis   . Chicken pox   . Colon polyp   . GERD (gastroesophageal reflux disease)   . Hyperlipidemia   . Hypertension   . Macular degeneration    Dr. Allyne G[place]Sanders in [place]  . Thyroid disease     Patient Active Problem List   Diagnosis Date Noted  . Dementia with behavioral disturbance 12/11/2014  . Rosacea 10/04/2014  . Hallucination, visual 10/04/2014  . B12 deficiency 10/04/2014  . Macular degeneration 07/10/2013  . Screening for breast cancer 05/23/2013  . Carpal tunnel syndrome 11/21/2012  . Medicare annual wellness visit, subsequent 07/18/2012  . Hypertension 04/06/2012    Past Surgical History:  Procedure Laterality Date  . ABDOMINAL HYSTERECTOMY  1971  . APPENDECTOMY  1942  . KNEE DISLOCATION SURGERY      Prior to Admission medications   Medication Sig Start Date End Date Taking? Authorizing Provider  atorvastatin (LIPITOR) 10 MG tablet Take 1 tablet (10 mg total) by mouth daily. 03/31/16   Tommie Samsook, Jayce G, DO  Cholecalciferol (VITAMIN D-3) 1000 UNITS CAPS Take 1 capsule by mouth daily.    [provider]  cyanocobalamin (,VITAMIN B-12,) 1000 MCG/ML injection Inject 1000mcg IM weekly x 3 weeks, then monthly after that. 02/22/14   Shelia MediaWalker, Jennifer A, MD   etodolac (LODINE) 500 MG tablet Take 1 tablet (500 mg total) by mouth 2 (two) times daily. 07/18/12   Shelia MediaWalker, Jennifer A, MD  levothyroxine (SYNTHROID, LEVOTHROID) 25 MCG tablet TAKE ONE TABLET BY MOUTH ONCE DAILY 01/02/16   Shelia MediaWalker, Jennifer A, MD  magnesium 30 MG tablet Take 30 mg by mouth daily.    [provider]  QUEtiapine (SEROQUEL) 25 MG tablet Take 3 tablets (75 mg total) by mouth at bedtime. 03/31/16   Tommie Samsook, Jayce G, DO  sertraline (ZOLOFT) 50 MG tablet TAKE ONE TABLET BY MOUTH ONCE DAILY *FOLLOW  UP  WITH  PCP  FOR  MORE  REFILLS* 06/04/16   Tommie Samsook, Jayce G, DO  Simethicone (GAS RELIEF 125 MAX ST PO) Take 1 tablet by mouth as needed.    [provider]  valsartan (DIOVAN) 160 MG tablet TAKE ONE TABLET BY MOUTH ONCE DAILY 12/25/15   Shelia MediaWalker, Jennifer A, MD    Allergies Codeine  Family History  Problem Relation Age of Onset  . Heart disease Mother   . Hypertension Mother   . Parkinsonism Mother   . Heart disease Father   . Hypertension Father   . Cancer Father        bone    Social History Social History  Substance Use Topics  . Smoking status: Current Every Day Smoker    Types: Cigarettes  . Smokeless tobacco: Never Used  . Alcohol use Yes  Comment: rarely    Review of Systems Constitutional: No fever/chills Eyes: No visual changes. ENT: No sore throat. Cardiovascular: Denies chest pain. Respiratory: Denies shortness of breath. Gastrointestinal: No abdominal pain.  No nausea, no vomiting.  No diarrhea.  No constipation. Genitourinary: Negative for dysuria. Musculoskeletal: Negative for neck pain.  Negative for back pain.Positive for right knee pain Integumentary: Negative for rash. Neurological: Negative for headaches, focal weakness or numbness.   ____________________________________________   PHYSICAL EXAM:  VITAL SIGNS: ED Triage Vitals  Enc Vitals Group     BP 01/21/17 2258 (!) 116/52     Pulse Rate 01/21/17 2258 80     Resp  01/21/17 2258 20     Temp 01/21/17 2258 97.4 F (36.3 C)     Temp Source 01/21/17 2258 Oral     SpO2 01/21/17 2258 97 %     Weight 01/21/17 2300 58.5 kg (129 lb)     Height 01/21/17 2300 1.575 m (5\' 2" )     Head Circumference --      Peak Flow --      Pain Score 01/21/17 2304 9     Pain Loc --      Pain Edu? --      Excl. in GC? --     Constitutional: Alert and oriented. Well appearing and in no acute distress. Eyes: Conjunctivae are normal. Head: Atraumatic. Mouth/Throat: Mucous membranes are moist. Neck: No stridor.  Cardiovascular: Normal rate, regular rhythm. Good peripheral circulation. Grossly normal heart sounds. Respiratory: Normal respiratory effort.  No retractions. Lungs CTAB. Gastrointestinal: Soft and nontender. No distention.  Musculoskeletal: Pain with anterior medial right knee palpation. Pain with valgus stress test and anterior drawer test.  Neurologic:  Normal speech and language. No gross focal neurologic deficits are appreciated.  Skin:  Skin is warm, dry and intact. No rash noted.    RADIOLOGY I, [place] Maxie Debose, personally viewed and evaluated these images (plain radiographs) as part of my medical decision making, as well as reviewing the written report by the radiologist.  Dg Knee Complete 4 Views Right  Result Date: 01/21/2017 CLINICAL DATA:  Fall, unable to bear weight EXAM: RIGHT KNEE - COMPLETE 4+ VIEW COMPARISON:  None. FINDINGS: Minimal lateral deviation of the patella. Small suprapatellar effusion. No fracture. Mild degenerative changes of the medial compartment. Joint space calcifications. Vascular calcifications. IMPRESSION: 1. No acute osseous abnormality 2. Chondrocalcinosis 3. Minimal lateral deviation of the patella. Electronically Signed   By: Jasmine Pang M.D.   On: 01/21/2017 23:57      Procedures   ____________________________________________   INITIAL IMPRESSION / ASSESSMENT AND PLAN / ED COURSE  Pertinent labs & imaging  results that were available during my care of the patient were reviewed by me and considered in my medical decision making (see chart for details).  84 year old female presenting with history of axonal trip and fall with right knee pain concern for possible ligamentous injury of the right knee. Patient given Percocet one tablet knee immobilizer pride patient given a walker and advised to follow-up with Dr. Trilby Drummer orthopedic surgeon for further outpatient evaluation. Spoke with the patient's daughter at length regarding side effects of Percocet need to observe her mother while she is taking it as could result in fall.      ____________________________________________  FINAL CLINICAL IMPRESSION(S) / ED DIAGNOSES  Final diagnoses:  Right knee injury, initial encounter     MEDICATIONS GIVEN DURING THIS VISIT:  Medications - No data to display  NEW OUTPATIENT MEDICATIONS STARTED DURING THIS VISIT:  New Prescriptions   No medications on file    Modified Medications   No medications on file    Discontinued Medications   No medications on file     Note:  This document was prepared using Dragon voice recognition software and may include unintentional dictation errors.    Darci Current, MD 01/22/17 [phone]

## 2017-01-22 NOTE — ED Notes (Signed)
ED Provider at bedside. 

## 2017-03-31 DIAGNOSIS — F0391 Unspecified dementia with behavioral disturbance: Secondary | ICD-10-CM | POA: Diagnosis not present

## 2017-03-31 DIAGNOSIS — R7309 Other abnormal glucose: Secondary | ICD-10-CM | POA: Diagnosis not present

## 2017-03-31 DIAGNOSIS — R441 Visual hallucinations: Secondary | ICD-10-CM | POA: Diagnosis not present

## 2017-03-31 DIAGNOSIS — E538 Deficiency of other specified B group vitamins: Secondary | ICD-10-CM | POA: Diagnosis not present

## 2017-03-31 DIAGNOSIS — Z0000 Encounter for general adult medical examination without abnormal findings: Secondary | ICD-10-CM | POA: Diagnosis not present

## 2017-03-31 DIAGNOSIS — I10 Essential (primary) hypertension: Secondary | ICD-10-CM | POA: Diagnosis not present

## 2017-03-31 DIAGNOSIS — E039 Hypothyroidism, unspecified: Secondary | ICD-10-CM | POA: Diagnosis not present

## 2017-03-31 DIAGNOSIS — E782 Mixed hyperlipidemia: Secondary | ICD-10-CM | POA: Diagnosis not present

## 2017-10-16 ENCOUNTER — Other Ambulatory Visit: Payer: Self-pay

## 2017-10-16 ENCOUNTER — Emergency Department: Payer: PPO

## 2017-10-16 ENCOUNTER — Inpatient Hospital Stay
Admission: EM | Admit: 2017-10-16 | Discharge: 2017-10-19 | DRG: 481 | Disposition: A | Discharge status: Skilled Nursing Facility | Payer: PPO | Attending: Specialist | Admitting: Specialist

## 2017-10-16 ENCOUNTER — Encounter: Payer: Self-pay | Admitting: Emergency Medicine

## 2017-10-16 DIAGNOSIS — E785 Hyperlipidemia, unspecified: Secondary | ICD-10-CM | POA: Diagnosis present

## 2017-10-16 DIAGNOSIS — F0281 Dementia in other diseases classified elsewhere with behavioral disturbance: Secondary | ICD-10-CM | POA: Diagnosis not present

## 2017-10-16 DIAGNOSIS — I10 Essential (primary) hypertension: Secondary | ICD-10-CM | POA: Diagnosis present

## 2017-10-16 DIAGNOSIS — S72001A Fracture of unspecified part of neck of right femur, initial encounter for closed fracture: Secondary | ICD-10-CM | POA: Diagnosis not present

## 2017-10-16 DIAGNOSIS — S72141A Displaced intertrochanteric fracture of right femur, initial encounter for closed fracture: Secondary | ICD-10-CM | POA: Diagnosis present

## 2017-10-16 DIAGNOSIS — K219 Gastro-esophageal reflux disease without esophagitis: Secondary | ICD-10-CM | POA: Diagnosis present

## 2017-10-16 DIAGNOSIS — M6281 Muscle weakness (generalized): Secondary | ICD-10-CM | POA: Diagnosis not present

## 2017-10-16 DIAGNOSIS — Z79899 Other long term (current) drug therapy: Secondary | ICD-10-CM

## 2017-10-16 DIAGNOSIS — F17210 Nicotine dependence, cigarettes, uncomplicated: Secondary | ICD-10-CM | POA: Diagnosis present

## 2017-10-16 DIAGNOSIS — F03918 Unspecified dementia, unspecified severity, with other behavioral disturbance: Secondary | ICD-10-CM | POA: Diagnosis present

## 2017-10-16 DIAGNOSIS — Z9181 History of falling: Secondary | ICD-10-CM | POA: Diagnosis not present

## 2017-10-16 DIAGNOSIS — E538 Deficiency of other specified B group vitamins: Secondary | ICD-10-CM | POA: Diagnosis not present

## 2017-10-16 DIAGNOSIS — Z7401 Bed confinement status: Secondary | ICD-10-CM | POA: Diagnosis not present

## 2017-10-16 DIAGNOSIS — E039 Hypothyroidism, unspecified: Secondary | ICD-10-CM | POA: Diagnosis present

## 2017-10-16 DIAGNOSIS — R441 Visual hallucinations: Secondary | ICD-10-CM | POA: Diagnosis not present

## 2017-10-16 DIAGNOSIS — M25551 Pain in right hip: Secondary | ICD-10-CM | POA: Diagnosis present

## 2017-10-16 DIAGNOSIS — Z885 Allergy status to narcotic agent status: Secondary | ICD-10-CM

## 2017-10-16 DIAGNOSIS — M1990 Unspecified osteoarthritis, unspecified site: Secondary | ICD-10-CM | POA: Diagnosis present

## 2017-10-16 DIAGNOSIS — F329 Major depressive disorder, single episode, unspecified: Secondary | ICD-10-CM | POA: Diagnosis present

## 2017-10-16 DIAGNOSIS — W1830XA Fall on same level, unspecified, initial encounter: Secondary | ICD-10-CM | POA: Diagnosis present

## 2017-10-16 DIAGNOSIS — Z79890 Hormone replacement therapy (postmenopausal): Secondary | ICD-10-CM | POA: Diagnosis not present

## 2017-10-16 DIAGNOSIS — S199XXA Unspecified injury of neck, initial encounter: Secondary | ICD-10-CM | POA: Diagnosis not present

## 2017-10-16 DIAGNOSIS — R4182 Altered mental status, unspecified: Secondary | ICD-10-CM | POA: Diagnosis not present

## 2017-10-16 DIAGNOSIS — A0472 Enterocolitis due to Clostridium difficile, not specified as recurrent: Secondary | ICD-10-CM | POA: Diagnosis not present

## 2017-10-16 DIAGNOSIS — Z87891 Personal history of nicotine dependence: Secondary | ICD-10-CM | POA: Diagnosis not present

## 2017-10-16 DIAGNOSIS — M810 Age-related osteoporosis without current pathological fracture: Secondary | ICD-10-CM | POA: Diagnosis not present

## 2017-10-16 DIAGNOSIS — S299XXA Unspecified injury of thorax, initial encounter: Secondary | ICD-10-CM | POA: Diagnosis not present

## 2017-10-16 DIAGNOSIS — W19XXXA Unspecified fall, initial encounter: Secondary | ICD-10-CM

## 2017-10-16 DIAGNOSIS — S72141D Displaced intertrochanteric fracture of right femur, subsequent encounter for closed fracture with routine healing: Secondary | ICD-10-CM | POA: Diagnosis not present

## 2017-10-16 DIAGNOSIS — E782 Mixed hyperlipidemia: Secondary | ICD-10-CM | POA: Diagnosis not present

## 2017-10-16 DIAGNOSIS — F0391 Unspecified dementia with behavioral disturbance: Secondary | ICD-10-CM | POA: Diagnosis present

## 2017-10-16 DIAGNOSIS — F419 Anxiety disorder, unspecified: Secondary | ICD-10-CM | POA: Diagnosis not present

## 2017-10-16 DIAGNOSIS — I739 Peripheral vascular disease, unspecified: Secondary | ICD-10-CM | POA: Diagnosis present

## 2017-10-16 DIAGNOSIS — Z23 Encounter for immunization: Secondary | ICD-10-CM

## 2017-10-16 DIAGNOSIS — R1312 Dysphagia, oropharyngeal phase: Secondary | ICD-10-CM | POA: Diagnosis not present

## 2017-10-16 DIAGNOSIS — R262 Difficulty in walking, not elsewhere classified: Secondary | ICD-10-CM | POA: Diagnosis not present

## 2017-10-16 DIAGNOSIS — S0990XA Unspecified injury of head, initial encounter: Secondary | ICD-10-CM | POA: Diagnosis not present

## 2017-10-16 DIAGNOSIS — H3530 Unspecified macular degeneration: Secondary | ICD-10-CM | POA: Diagnosis not present

## 2017-10-16 HISTORY — DX: Anxiety disorder, unspecified: F41.9

## 2017-10-16 MED ORDER — ONDANSETRON HCL 4 MG/2ML IJ SOLN
4.0000 mg | Freq: Once | INTRAMUSCULAR | Status: AC
  Administered 2017-10-16: 4 mg via INTRAVENOUS
  Filled 2017-10-16: qty 2

## 2017-10-16 MED ORDER — MORPHINE SULFATE (PF) 4 MG/ML IV SOLN
4.0000 mg | Freq: Once | INTRAVENOUS | Status: AC
  Administered 2017-10-16: 4 mg via INTRAVENOUS
  Filled 2017-10-16: qty 1

## 2017-10-16 NOTE — ED Triage Notes (Addendum)
Pt arrives via [facility] from home s/p fall, which she does not recall. Family found her on the back porch on the ground. Pt reporting 10/10 pain after 100 mcg fentanyl en route. EMS V/S: BP 160/86; HR 86; 98% room air; CBG 193.   Right leg shorter and externally rotated.

## 2017-10-17 ENCOUNTER — Inpatient Hospital Stay: Payer: PPO | Admitting: Certified Registered Nurse Anesthetist

## 2017-10-17 ENCOUNTER — Encounter
Admission: EM | Disposition: A | Discharge status: Skilled Nursing Facility | Payer: Self-pay | Source: Home / Self Care | Attending: Specialist

## 2017-10-17 ENCOUNTER — Other Ambulatory Visit: Payer: Self-pay

## 2017-10-17 ENCOUNTER — Inpatient Hospital Stay: Payer: PPO

## 2017-10-17 ENCOUNTER — Emergency Department: Payer: PPO

## 2017-10-17 ENCOUNTER — Encounter: Payer: Self-pay | Admitting: Internal Medicine

## 2017-10-17 DIAGNOSIS — F17210 Nicotine dependence, cigarettes, uncomplicated: Secondary | ICD-10-CM | POA: Diagnosis present

## 2017-10-17 DIAGNOSIS — K219 Gastro-esophageal reflux disease without esophagitis: Secondary | ICD-10-CM | POA: Diagnosis present

## 2017-10-17 DIAGNOSIS — S72001A Fracture of unspecified part of neck of right femur, initial encounter for closed fracture: Secondary | ICD-10-CM | POA: Diagnosis present

## 2017-10-17 DIAGNOSIS — F0391 Unspecified dementia with behavioral disturbance: Secondary | ICD-10-CM | POA: Diagnosis present

## 2017-10-17 DIAGNOSIS — I10 Essential (primary) hypertension: Secondary | ICD-10-CM | POA: Diagnosis present

## 2017-10-17 DIAGNOSIS — Z885 Allergy status to narcotic agent status: Secondary | ICD-10-CM | POA: Diagnosis not present

## 2017-10-17 DIAGNOSIS — Z79890 Hormone replacement therapy (postmenopausal): Secondary | ICD-10-CM | POA: Diagnosis not present

## 2017-10-17 DIAGNOSIS — E785 Hyperlipidemia, unspecified: Secondary | ICD-10-CM | POA: Diagnosis present

## 2017-10-17 DIAGNOSIS — E039 Hypothyroidism, unspecified: Secondary | ICD-10-CM | POA: Diagnosis present

## 2017-10-17 DIAGNOSIS — I739 Peripheral vascular disease, unspecified: Secondary | ICD-10-CM | POA: Diagnosis present

## 2017-10-17 DIAGNOSIS — F329 Major depressive disorder, single episode, unspecified: Secondary | ICD-10-CM | POA: Diagnosis present

## 2017-10-17 DIAGNOSIS — W1830XA Fall on same level, unspecified, initial encounter: Secondary | ICD-10-CM | POA: Diagnosis present

## 2017-10-17 DIAGNOSIS — Z23 Encounter for immunization: Secondary | ICD-10-CM | POA: Diagnosis present

## 2017-10-17 DIAGNOSIS — Z79899 Other long term (current) drug therapy: Secondary | ICD-10-CM | POA: Diagnosis not present

## 2017-10-17 DIAGNOSIS — S72141A Displaced intertrochanteric fracture of right femur, initial encounter for closed fracture: Secondary | ICD-10-CM | POA: Diagnosis present

## 2017-10-17 DIAGNOSIS — M1990 Unspecified osteoarthritis, unspecified site: Secondary | ICD-10-CM | POA: Diagnosis present

## 2017-10-17 DIAGNOSIS — M25551 Pain in right hip: Secondary | ICD-10-CM | POA: Diagnosis present

## 2017-10-17 HISTORY — PX: INTRAMEDULLARY (IM) NAIL INTERTROCHANTERIC: SHX5875

## 2017-10-17 LAB — URINALYSIS, ROUTINE W REFLEX MICROSCOPIC
Bilirubin Urine: NEGATIVE
Glucose, UA: NEGATIVE mg/dL
HGB URINE DIPSTICK: NEGATIVE
Ketones, ur: 5 mg/dL — AB
Nitrite: POSITIVE — AB
PROTEIN: 30 mg/dL — AB
Specific Gravity, Urine: 1.021 (ref 1.005–1.030)
pH: 5 (ref 5.0–8.0)

## 2017-10-17 LAB — CBC
HCT: 32.7 % — ABNORMAL LOW (ref 35.0–47.0)
HCT: 39.7 % (ref 35.0–47.0)
Hemoglobin: 10.8 g/dL — ABNORMAL LOW (ref 12.0–16.0)
Hemoglobin: 13.1 g/dL (ref 12.0–16.0)
MCH: 29.7 pg (ref 26.0–34.0)
MCH: 30.1 pg (ref 26.0–34.0)
MCHC: 32.9 g/dL (ref 32.0–36.0)
MCHC: 33 g/dL (ref 32.0–36.0)
MCV: 90.2 fL (ref 80.0–100.0)
MCV: 91.4 fL (ref 80.0–100.0)
PLATELETS: 320 10*3/uL (ref 150–440)
Platelets: 292 10*3/uL (ref 150–440)
RBC: 3.58 MIL/uL — ABNORMAL LOW (ref 3.80–5.20)
RBC: 4.4 MIL/uL (ref 3.80–5.20)
RDW: 14.7 % — AB (ref 11.5–14.5)
RDW: 14.8 % — AB (ref 11.5–14.5)
WBC: 12.2 10*3/uL — ABNORMAL HIGH (ref 3.6–11.0)
WBC: 16.4 10*3/uL — AB (ref 3.6–11.0)

## 2017-10-17 LAB — BASIC METABOLIC PANEL
ANION GAP: 9 (ref 5–15)
BUN: 12 mg/dL (ref 6–20)
CALCIUM: 9.1 mg/dL (ref 8.9–10.3)
CHLORIDE: 107 mmol/L (ref 101–111)
CO2: 24 mmol/L (ref 22–32)
Creatinine, Ser: 0.78 mg/dL (ref 0.44–1.00)
GFR calc Af Amer: >60 mL/min (ref >60)
GFR calc non Af Amer: >60 mL/min (ref >60)
Glucose, Bld: 155 mg/dL — ABNORMAL HIGH (ref 65–99)
Potassium: 3.8 mmol/L (ref 3.5–5.1)
Sodium: 140 mmol/L (ref 135–145)

## 2017-10-17 LAB — TROPONIN I: Troponin I: <0.03 ng/mL (ref <0.03)

## 2017-10-17 LAB — PROTIME-INR
INR: 1.03
PROTHROMBIN TIME: 13.4 s (ref 11.4–15.2)

## 2017-10-17 LAB — CREATININE, SERUM
CREATININE: 0.86 mg/dL (ref 0.44–1.00)
GFR calc Af Amer: >60 mL/min (ref >60)
GFR calc non Af Amer: >60 mL/min (ref >60)

## 2017-10-17 LAB — ABO/RH: ABO/RH(D): O POS

## 2017-10-17 LAB — SURGICAL PCR SCREEN
MRSA, PCR: NEGATIVE
STAPHYLOCOCCUS AUREUS: POSITIVE — AB

## 2017-10-17 SURGERY — FIXATION, FRACTURE, INTERTROCHANTERIC, WITH INTRAMEDULLARY ROD
Anesthesia: Spinal | Laterality: Right | Wound class: Clean

## 2017-10-17 MED ORDER — DONEPEZIL HCL 5 MG PO TABS
10.0000 mg | ORAL_TABLET | Freq: Every day | ORAL | Status: DC
  Administered 2017-10-17 – 2017-10-18 (×2): 10 mg via ORAL
  Filled 2017-10-17 (×2): qty 2
  Filled 2017-10-17: qty 1

## 2017-10-17 MED ORDER — LIP MEDEX EX OINT: 1.0000 "application " | TOPICAL_OINTMENT | CUTANEOUS | Status: DC | PRN

## 2017-10-17 MED ORDER — PROPOFOL 500 MG/50ML IV EMUL
INTRAVENOUS | Status: DC | PRN
  Administered 2017-10-17: 20 ug/kg/min via INTRAVENOUS

## 2017-10-17 MED ORDER — MIDAZOLAM HCL 5 MG/5ML IJ SOLN
INTRAMUSCULAR | Status: DC | PRN
  Administered 2017-10-17 (×2): 1 mg via INTRAVENOUS

## 2017-10-17 MED ORDER — IRBESARTAN 150 MG PO TABS
150.0000 mg | ORAL_TABLET | Freq: Every day | ORAL | Status: DC
  Administered 2017-10-18 – 2017-10-19 (×2): 150 mg via ORAL
  Filled 2017-10-17 (×2): qty 1

## 2017-10-17 MED ORDER — FENTANYL CITRATE (PF) 100 MCG/2ML IJ SOLN
INTRAMUSCULAR | Status: DC | PRN
  Administered 2017-10-17 (×2): 50 ug via INTRAVENOUS

## 2017-10-17 MED ORDER — NEOMYCIN-POLYMYXIN B GU 40-200000 IR SOLN
Status: DC | PRN
  Administered 2017-10-17: 2 mL

## 2017-10-17 MED ORDER — BUPIVACAINE HCL (PF) 0.5 % IJ SOLN
INTRAMUSCULAR | Status: DC | PRN
  Administered 2017-10-17: 3 mL via INTRATHECAL

## 2017-10-17 MED ORDER — FERROUS SULFATE 325 (65 FE) MG PO TABS
325.0000 mg | ORAL_TABLET | Freq: Every day | ORAL | Status: DC
  Administered 2017-10-18 – 2017-10-19 (×2): 325 mg via ORAL
  Filled 2017-10-17 (×2): qty 1

## 2017-10-17 MED ORDER — BUPIVACAINE-EPINEPHRINE (PF) 0.5% -1:200000 IJ SOLN
INTRAMUSCULAR | Status: AC
  Filled 2017-10-17: qty 30

## 2017-10-17 MED ORDER — ACETAMINOPHEN 325 MG PO TABS: 650.0000 mg | ORAL_TABLET | Freq: Four times a day (QID) | ORAL | Status: DC | PRN

## 2017-10-17 MED ORDER — MORPHINE SULFATE (PF) 2 MG/ML IV SOLN
2.0000 mg | INTRAVENOUS | Status: DC | PRN
  Administered 2017-10-17: 2 mg via INTRAVENOUS
  Filled 2017-10-17: qty 1

## 2017-10-17 MED ORDER — LEVOTHYROXINE SODIUM 25 MCG PO TABS
25.0000 ug | ORAL_TABLET | Freq: Every day | ORAL | Status: DC
Start: 2017-10-17 — End: 2017-10-19
  Administered 2017-10-18 – 2017-10-19 (×2): 25 ug via ORAL
  Filled 2017-10-17 (×2): qty 1

## 2017-10-17 MED ORDER — SODIUM CHLORIDE 0.9 % IV SOLN
INTRAVENOUS | Status: DC
  Administered 2017-10-17: 05:00:00 via INTRAVENOUS

## 2017-10-17 MED ORDER — FOLIC ACID 1 MG PO TABS
1.0000 mg | ORAL_TABLET | Freq: Every day | ORAL | Status: DC
  Administered 2017-10-18 – 2017-10-19 (×2): 1 mg via ORAL
  Filled 2017-10-17 (×2): qty 1

## 2017-10-17 MED ORDER — ATORVASTATIN CALCIUM 10 MG PO TABS
10.0000 mg | ORAL_TABLET | Freq: Every day | ORAL | Status: DC
  Administered 2017-10-18 – 2017-10-19 (×2): 10 mg via ORAL
  Filled 2017-10-17 (×2): qty 1

## 2017-10-17 MED ORDER — ONDANSETRON HCL 4 MG/2ML IJ SOLN: 4.0000 mg | Freq: Four times a day (QID) | INTRAMUSCULAR | Status: DC | PRN

## 2017-10-17 MED ORDER — ONDANSETRON HCL 4 MG PO TABS: 4.0000 mg | ORAL_TABLET | Freq: Four times a day (QID) | ORAL | Status: DC | PRN

## 2017-10-17 MED ORDER — SODIUM CHLORIDE 0.45 % IV SOLN
INTRAVENOUS | Status: DC
  Administered 2017-10-17 – 2017-10-18 (×2): via INTRAVENOUS

## 2017-10-17 MED ORDER — BUPIVACAINE-EPINEPHRINE (PF) 0.25% -1:200000 IJ SOLN
INTRAMUSCULAR | Status: DC | PRN
  Administered 2017-10-17: 30 mL via PERINEURAL

## 2017-10-17 MED ORDER — SERTRALINE HCL 50 MG PO TABS
50.0000 mg | ORAL_TABLET | Freq: Every day | ORAL | Status: DC
  Administered 2017-10-18 – 2017-10-19 (×2): 50 mg via ORAL
  Filled 2017-10-17 (×2): qty 1

## 2017-10-17 MED ORDER — EPHEDRINE SULFATE 50 MG/ML IJ SOLN
INTRAMUSCULAR | Status: DC | PRN
  Administered 2017-10-17: 10 mg via INTRAVENOUS

## 2017-10-17 MED ORDER — MAGNESIUM HYDROXIDE 400 MG/5ML PO SUSP
30.0000 mL | Freq: Every day | ORAL | Status: DC | PRN
Start: 2017-10-17 — End: 2017-10-19

## 2017-10-17 MED ORDER — ZINC GLUCONATE 50 MG PO TABS: 50.0000 mg | ORAL_TABLET | Freq: Every day | ORAL | Status: DC

## 2017-10-17 MED ORDER — PROPOFOL 10 MG/ML IV BOLUS
INTRAVENOUS | Status: DC | PRN
  Administered 2017-10-17 (×3): 20 mg via INTRAVENOUS

## 2017-10-17 MED ORDER — INFLUENZA VAC SPLIT QUAD 0.5 ML IM SUSY
0.5000 mL | PREFILLED_SYRINGE | INTRAMUSCULAR | Status: AC
  Administered 2017-10-18: 0.5 mL via INTRAMUSCULAR
  Filled 2017-10-17: qty 0.5

## 2017-10-17 MED ORDER — CEFAZOLIN SODIUM-DEXTROSE 2-4 GM/100ML-% IV SOLN
2.0000 g | INTRAVENOUS | Status: AC
  Administered 2017-10-17: 2 g via INTRAVENOUS
  Filled 2017-10-17: qty 100

## 2017-10-17 MED ORDER — ONDANSETRON HCL 4 MG/2ML IJ SOLN: 4.0000 mg | Freq: Once | INTRAMUSCULAR | Status: DC | PRN

## 2017-10-17 MED ORDER — OXYCODONE-ACETAMINOPHEN 5-325 MG PO TABS
1.0000 | ORAL_TABLET | ORAL | Status: DC | PRN
  Administered 2017-10-17 – 2017-10-19 (×5): 1 via ORAL
  Filled 2017-10-17 (×5): qty 1

## 2017-10-17 MED ORDER — MUPIROCIN 2 % EX OINT
1.0000 "application " | TOPICAL_OINTMENT | Freq: Two times a day (BID) | CUTANEOUS | Status: DC
  Administered 2017-10-17 – 2017-10-19 (×5): 1 via NASAL
  Filled 2017-10-17: qty 22

## 2017-10-17 MED ORDER — MORPHINE SULFATE (PF) 2 MG/ML IV SOLN: 1.0000 mg | INTRAVENOUS | Status: DC | PRN

## 2017-10-17 MED ORDER — ACETAMINOPHEN 650 MG RE SUPP: 650.0000 mg | Freq: Four times a day (QID) | RECTAL | Status: DC | PRN

## 2017-10-17 MED ORDER — BISACODYL 10 MG RE SUPP
10.0000 mg | Freq: Every day | RECTAL | Status: DC | PRN
  Administered 2017-10-19: 10 mg via RECTAL
  Filled 2017-10-17: qty 1

## 2017-10-17 MED ORDER — PNEUMOCOCCAL VAC POLYVALENT 25 MCG/0.5ML IJ INJ
0.5000 mL | INJECTION | INTRAMUSCULAR | Status: AC
  Administered 2017-10-18: 0.5 mL via INTRAMUSCULAR
  Filled 2017-10-17: qty 0.5

## 2017-10-17 MED ORDER — METOCLOPRAMIDE HCL 5 MG/ML IJ SOLN: 5.0000 mg | Freq: Three times a day (TID) | INTRAMUSCULAR | Status: DC | PRN

## 2017-10-17 MED ORDER — ONDANSETRON HCL 4 MG/2ML IJ SOLN
4.0000 mg | Freq: Four times a day (QID) | INTRAMUSCULAR | Status: DC | PRN
Start: 2017-10-17 — End: 2017-10-19

## 2017-10-17 MED ORDER — CEFAZOLIN SODIUM-DEXTROSE 2-4 GM/100ML-% IV SOLN
2.0000 g | Freq: Three times a day (TID) | INTRAVENOUS | Status: AC
  Administered 2017-10-17 – 2017-10-18 (×2): 2 g via INTRAVENOUS
  Filled 2017-10-17 (×3): qty 100

## 2017-10-17 MED ORDER — LIDOCAINE HCL (PF) 2 % IJ SOLN
INTRAMUSCULAR | Status: AC
  Filled 2017-10-17: qty 10

## 2017-10-17 MED ORDER — BLISTEX MEDICATED EX OINT
TOPICAL_OINTMENT | CUTANEOUS | Status: DC | PRN
  Administered 2017-10-17: 07:00:00 via TOPICAL
  Filled 2017-10-17: qty 6.3

## 2017-10-17 MED ORDER — LACTATED RINGERS IV SOLN
INTRAVENOUS | Status: DC | PRN
  Administered 2017-10-17: 12:00:00 via INTRAVENOUS

## 2017-10-17 MED ORDER — CHLORHEXIDINE GLUCONATE 4 % EX LIQD
Freq: Once | CUTANEOUS | Status: AC
  Administered 2017-10-17: 07:00:00 via TOPICAL

## 2017-10-17 MED ORDER — CLINDAMYCIN PHOSPHATE 600 MG/50ML IV SOLN
600.0000 mg | INTRAVENOUS | Status: AC
  Administered 2017-10-17: 600 mg via INTRAVENOUS
  Filled 2017-10-17 (×2): qty 50

## 2017-10-17 MED ORDER — NEOMYCIN-POLYMYXIN B GU 40-200000 IR SOLN
Status: AC
  Filled 2017-10-17: qty 2

## 2017-10-17 MED ORDER — CHLORHEXIDINE GLUCONATE CLOTH 2 % EX PADS
6.0000 | MEDICATED_PAD | Freq: Every day | CUTANEOUS | Status: DC
  Administered 2017-10-18 – 2017-10-19 (×2): 6 via TOPICAL

## 2017-10-17 MED ORDER — PHENOL 1.4 % MT LIQD
1.0000 | OROMUCOSAL | Status: DC | PRN
  Filled 2017-10-17: qty 177

## 2017-10-17 MED ORDER — SODIUM CHLORIDE 0.9 % IV SOLN: INTRAVENOUS | Status: DC

## 2017-10-17 MED ORDER — CLINDAMYCIN PHOSPHATE 600 MG/50ML IV SOLN
600.0000 mg | Freq: Three times a day (TID) | INTRAVENOUS | Status: AC
  Administered 2017-10-17 – 2017-10-18 (×2): 600 mg via INTRAVENOUS
  Filled 2017-10-17 (×3): qty 50

## 2017-10-17 MED ORDER — DOCUSATE SODIUM 100 MG PO CAPS
100.0000 mg | ORAL_CAPSULE | Freq: Two times a day (BID) | ORAL | Status: DC
  Administered 2017-10-17 – 2017-10-19 (×4): 100 mg via ORAL
  Filled 2017-10-17 (×4): qty 1

## 2017-10-17 MED ORDER — QUETIAPINE FUMARATE 25 MG PO TABS: 75.0000 mg | ORAL_TABLET | Freq: Every day | ORAL | Status: DC

## 2017-10-17 MED ORDER — ALUM & MAG HYDROXIDE-SIMETH 200-200-20 MG/5ML PO SUSP: 30.0000 mL | ORAL | Status: DC | PRN

## 2017-10-17 MED ORDER — MIRTAZAPINE 15 MG PO TABS
7.5000 mg | ORAL_TABLET | Freq: Every day | ORAL | Status: DC
  Administered 2017-10-17 – 2017-10-18 (×2): 7.5 mg via ORAL
  Filled 2017-10-17 (×2): qty 1

## 2017-10-17 MED ORDER — ENOXAPARIN SODIUM 30 MG/0.3ML ~~LOC~~ SOLN
30.0000 mg | SUBCUTANEOUS | Status: DC
  Administered 2017-10-18: 30 mg via SUBCUTANEOUS
  Filled 2017-10-17: qty 0.3

## 2017-10-17 MED ORDER — LIDOCAINE HCL (PF) 2 % IJ SOLN
INTRAMUSCULAR | Status: DC | PRN
  Administered 2017-10-17: 50 mg

## 2017-10-17 MED ORDER — ZOLPIDEM TARTRATE 5 MG PO TABS: 5.0000 mg | ORAL_TABLET | Freq: Every evening | ORAL | Status: DC | PRN

## 2017-10-17 MED ORDER — BUPIVACAINE-EPINEPHRINE (PF) 0.25% -1:200000 IJ SOLN
INTRAMUSCULAR | Status: AC
  Filled 2017-10-17: qty 30

## 2017-10-17 MED ORDER — FENTANYL CITRATE (PF) 100 MCG/2ML IJ SOLN: 25.0000 ug | INTRAMUSCULAR | Status: DC | PRN

## 2017-10-17 MED ORDER — PHENYLEPHRINE HCL 10 MG/ML IJ SOLN
INTRAMUSCULAR | Status: DC | PRN
  Administered 2017-10-17: 100 ug via INTRAVENOUS
  Administered 2017-10-17: 50 ug via INTRAVENOUS
  Administered 2017-10-17 (×2): 100 ug via INTRAVENOUS

## 2017-10-17 MED ORDER — MENTHOL 3 MG MT LOZG
1.0000 | LOZENGE | OROMUCOSAL | Status: DC | PRN
  Filled 2017-10-17: qty 9

## 2017-10-17 MED ORDER — HYDRALAZINE HCL 20 MG/ML IJ SOLN: 10.0000 mg | INTRAMUSCULAR | Status: DC | PRN

## 2017-10-17 MED ORDER — FENTANYL CITRATE (PF) 100 MCG/2ML IJ SOLN
INTRAMUSCULAR | Status: AC
  Filled 2017-10-17: qty 2

## 2017-10-17 MED ORDER — MIDAZOLAM HCL 2 MG/2ML IJ SOLN
INTRAMUSCULAR | Status: AC
  Filled 2017-10-17: qty 2

## 2017-10-17 MED ORDER — FLEET ENEMA 7-19 GM/118ML RE ENEM: 1.0000 | ENEMA | Freq: Once | RECTAL | Status: DC | PRN

## 2017-10-17 MED ORDER — QUETIAPINE FUMARATE 25 MG PO TABS
100.0000 mg | ORAL_TABLET | Freq: Every day | ORAL | Status: DC
  Administered 2017-10-17 – 2017-10-18 (×2): 100 mg via ORAL
  Filled 2017-10-17 (×2): qty 4

## 2017-10-17 MED ORDER — METOCLOPRAMIDE HCL 10 MG PO TABS: 5.0000 mg | ORAL_TABLET | Freq: Three times a day (TID) | ORAL | Status: DC | PRN

## 2017-10-17 MED ORDER — PROPOFOL 500 MG/50ML IV EMUL
INTRAVENOUS | Status: AC
  Filled 2017-10-17: qty 50

## 2017-10-17 SURGICAL SUPPLY — 35 items
BIT DRILL SPINE 4.0X260 (BIT) ×2 IMPLANT
BLADE HELICAL TI 11.0X100 STRL (Orthopedic Implant) ×2 IMPLANT
BNDG COHESIVE 4X5 TAN STRL (GAUZE/BANDAGES/DRESSINGS) ×2 IMPLANT
CANISTER SUCT 1200ML W/VALVE (MISCELLANEOUS) ×2 IMPLANT
CHLORAPREP W/TINT 26ML (MISCELLANEOUS) ×4 IMPLANT
DRAPE C-ARMOR (DRAPES) IMPLANT
DRAPE INCISE 23X17 IOBAN STRL (DRAPES) ×1
DRAPE INCISE IOBAN 23X17 STRL (DRAPES) ×1 IMPLANT
DRSG AQUACEL AG ADV 3.5X10 (GAUZE/BANDAGES/DRESSINGS) IMPLANT
DRSG AQUACEL AG ADV 3.5X14 (GAUZE/BANDAGES/DRESSINGS) IMPLANT
ELECT REM PT RETURN 9FT ADLT (ELECTROSURGICAL) ×2
ELECTRODE REM PT RTRN 9FT ADLT (ELECTROSURGICAL) ×1 IMPLANT
GAUZE PETRO XEROFOAM 1X8 (MISCELLANEOUS) IMPLANT
GAUZE SPONGE 4X4 12PLY STRL (GAUZE/BANDAGES/DRESSINGS) ×2 IMPLANT
GLOVE INDICATOR 8.0 STRL GRN (GLOVE) ×2 IMPLANT
GLOVE SURG ORTHO 8.5 STRL (GLOVE) ×2 IMPLANT
GOWN STRL REUS W/ TWL LRG LVL3 (GOWN DISPOSABLE) ×1 IMPLANT
GOWN STRL REUS W/TWL LRG LVL3 (GOWN DISPOSABLE) ×1
GOWN STRL REUS W/TWL LRG LVL4 (GOWN DISPOSABLE) ×2 IMPLANT
GUIDEWIRE 3.2X400 (WIRE) ×4 IMPLANT
KIT TURNOVER KIT A (KITS) ×2 IMPLANT
MAT BLUE FLOOR 46X72 FLO (MISCELLANEOUS) ×2 IMPLANT
NAIL TROCH FX 11/130D 170-S (Nail) ×2 IMPLANT
NEEDLE SPNL 18GX3.5 QUINCKE PK (NEEDLE) ×2 IMPLANT
NS IRRIG 500ML POUR BTL (IV SOLUTION) ×2 IMPLANT
PACK HIP COMPR (MISCELLANEOUS) ×2 IMPLANT
REAMER ROD DEEP FLUTE 2.5X950 (INSTRUMENTS) ×2 IMPLANT
SCREW LOCK TI 5.0X34 F/IM NAIL (Screw) ×2 IMPLANT
STAPLER SKIN PROX 35W (STAPLE) ×2 IMPLANT
SUCTION FRAZIER HANDLE 10FR (MISCELLANEOUS) ×1
SUCTION TUBE FRAZIER 10FR DISP (MISCELLANEOUS) ×1 IMPLANT
SUT VIC AB 0 CT1 36 (SUTURE) ×2 IMPLANT
SUT VIC AB 2-0 CT1 27 (SUTURE) ×1
SUT VIC AB 2-0 CT1 TAPERPNT 27 (SUTURE) ×1 IMPLANT
SYR 30ML LL (SYRINGE) ×2 IMPLANT

## 2017-10-17 NOTE — H&P (Signed)
THE PATIENT WAS SEEN PRIOR TO SURGERY TODAY.  HISTORY, ALLERGIES, HOME MEDICATIONS AND OPERATIVE PROCEDURE WERE REVIEWED. RISKS AND BENEFITS OF SURGERY DISCUSSED WITH PATIENT AGAIN.  NO CHANGES FROM INITIAL HISTORY AND PHYSICAL NOTED.    

## 2017-10-17 NOTE — Consult Note (Signed)
ORTHOPAEDIC CONSULTATION  REQUESTING PHYSICIAN: Houston Siren, MD  Chief Complaint: Right hip pain  HPI: Joann [name] is a 84 y.o. female who complains of right hip pain following a fall at home last night.  There was no syncope but a mechanical trip.  Exam and x-rays in the emergency room revealed a comminuted displaced intertrochanteric fracture of the right hip.  The patient been admitted for medical evaluation and surgery.  I have discussed treatment options with the patient and her family.  The risks and benefits of surgery versus nonoperative treatment were discussed.  Postop protocol was discussed.  At their request we will proceed with operative fixation of the fracture today.  Past Medical History:  Diagnosis Date  . Anxiety   . Arthritis   . Chicken pox   . Colon polyp   . GERD (gastroesophageal reflux disease)   . Hyperlipidemia   . Hypertension   . Macular degeneration    Dr. Allyne Gee in [place]  . Thyroid disease    Past Surgical History:  Procedure Laterality Date  . ABDOMINAL HYSTERECTOMY  1971  . APPENDECTOMY  1942  . KNEE DISLOCATION SURGERY     Social History   Socioeconomic History  . Marital status: Married    Spouse name: None  . Number of children: None  . Years of education: None  . Highest education level: None  Social Needs  . Financial resource strain: None  . Food insecurity - worry: None  . Food insecurity - inability: None  . Transportation needs - medical: None  . Transportation needs - non-medical: None  Occupational History  . None  Tobacco Use  . Smoking status: Current Every Day Smoker    Types: Cigarettes  . Smokeless tobacco: Never Used  Substance and Sexual Activity  . Alcohol use: Yes    Comment: rarely  . Drug use: No  . Sexual activity: No  Other Topics Concern  . None  Social History Narrative   Lives in [place].      Retired - Worked as Water quality scientist   Family History  Problem Relation Age of Onset   . Heart disease Mother   . Hypertension Mother   . Parkinsonism Mother   . Heart disease Father   . Hypertension Father   . Cancer Father        bone   Allergies  Allergen Reactions  . Codeine Other (See Comments)    Paralysis    Prior to Admission medications   Medication Sig Start Date End Date Taking? Authorizing Provider  atorvastatin (LIPITOR) 10 MG tablet Take 1 tablet (10 mg total) by mouth daily. 03/31/16  Yes Cook, Jayce G, DO  donepezil (ARICEPT) 10 MG tablet Take 10 mg by mouth at bedtime. 09/13/17  Yes [provider]  folic acid (FOLVITE) 1 MG tablet Take 1 mg by mouth daily.   Yes [provider]  levothyroxine (SYNTHROID, LEVOTHROID) 25 MCG tablet TAKE ONE TABLET BY MOUTH ONCE DAILY 01/02/16  Yes Shelia Media, MD  mirtazapine (REMERON) 7.5 MG tablet Take 7.5 mg by mouth at bedtime. 09/13/17  Yes [provider]  QUEtiapine (SEROQUEL) 50 MG tablet Take 100 mg by mouth at bedtime. 09/14/17  Yes [provider]  sertraline (ZOLOFT) 50 MG tablet TAKE ONE TABLET BY MOUTH ONCE DAILY *FOLLOW  UP  WITH  PCP  FOR  MORE  REFILLS* 06/04/16  Yes Everlene Other G, DO  zinc gluconate 50 MG tablet Take 50 mg by  mouth daily.   Yes [provider]   Dg Chest 1 View  Result Date: 10/17/2017 CLINICAL DATA:  Fall with hip fracture EXAM: CHEST 1 VIEW COMPARISON:  None FINDINGS: No focal pulmonary opacity or pleural effusion. Normal cardiomediastinal silhouette with aortic atherosclerosis. No pneumothorax. IMPRESSION: No active disease. Electronically Signed   By: Jasmine PangKim  Fujinaga M.D.   On: 10/17/2017 00:52   Ct Head Wo Contrast  Result Date: 10/17/2017 CLINICAL DATA:  Fall EXAM: CT HEAD WITHOUT CONTRAST CT CERVICAL SPINE WITHOUT CONTRAST TECHNIQUE: Multidetector CT imaging of the head and cervical spine was performed following the standard protocol without intravenous contrast. Multiplanar CT image reconstructions of the cervical spine were also  generated. COMPARISON:  None. FINDINGS: CT HEAD FINDINGS Brain: No acute territorial infarction, haemorrhage or intracranial mass is visualized. Moderate atrophy. Mild small vessel ischemic changes of the white matter. Vascular: No hyperdense vessels.  Carotid vascular calcification Skull: Normal. Negative for fracture or focal lesion. Sinuses/Orbits: Mild mucosal thickening in the sphenoid and ethmoid sinuses. No acute orbital abnormality Other: None CT CERVICAL SPINE FINDINGS Alignment: Reversal of cervical lordosis. Trace anterolisthesis of C3 on C4 and C4 on C5. Facet alignment within normal limits. Skull base and vertebrae: No acute fracture. No primary bone lesion or focal pathologic process. Soft tissues and spinal canal: No prevertebral fluid or swelling. No visible canal hematoma. Disc levels: Moderate degenerative changes at C5-C6 and C6-C7. Mild degenerative changes at C4-C5. Upper chest: Negative. Other: None IMPRESSION: 1. No CT evidence for acute intracranial abnormality. Atrophy and mild small vessel ischemic changes of the white matter 2. Reversal of cervical lordosis with trace anterolisthesis of C3 on C4 and C4 on C5. No definite acute fracture seen. Electronically Signed   By: Jasmine PangKim  Fujinaga M.D.   On: 10/17/2017 00:42   Ct Cervical Spine Wo Contrast  Result Date: 10/17/2017 CLINICAL DATA:  Fall EXAM: CT HEAD WITHOUT CONTRAST CT CERVICAL SPINE WITHOUT CONTRAST TECHNIQUE: Multidetector CT imaging of the head and cervical spine was performed following the standard protocol without intravenous contrast. Multiplanar CT image reconstructions of the cervical spine were also generated. COMPARISON:  None. FINDINGS: CT HEAD FINDINGS Brain: No acute territorial infarction, haemorrhage or intracranial mass is visualized. Moderate atrophy. Mild small vessel ischemic changes of the white matter. Vascular: No hyperdense vessels.  Carotid vascular calcification Skull: Normal. Negative for fracture or focal  lesion. Sinuses/Orbits: Mild mucosal thickening in the sphenoid and ethmoid sinuses. No acute orbital abnormality Other: None CT CERVICAL SPINE FINDINGS Alignment: Reversal of cervical lordosis. Trace anterolisthesis of C3 on C4 and C4 on C5. Facet alignment within normal limits. Skull base and vertebrae: No acute fracture. No primary bone lesion or focal pathologic process. Soft tissues and spinal canal: No prevertebral fluid or swelling. No visible canal hematoma. Disc levels: Moderate degenerative changes at C5-C6 and C6-C7. Mild degenerative changes at C4-C5. Upper chest: Negative. Other: None IMPRESSION: 1. No CT evidence for acute intracranial abnormality. Atrophy and mild small vessel ischemic changes of the white matter 2. Reversal of cervical lordosis with trace anterolisthesis of C3 on C4 and C4 on C5. No definite acute fracture seen. Electronically Signed   By: Jasmine PangKim  Fujinaga M.D.   On: 10/17/2017 00:42   Dg Hip Unilat With Pelvis 2-3 Views Right  Result Date: 10/17/2017 CLINICAL DATA:  Hip pain after fall EXAM: DG HIP (WITH OR WITHOUT PELVIS) 2-3V RIGHT COMPARISON:  CT 02/07/2011 FINDINGS: SI joints are non widened. Pubic symphysis and rami are intact. Left  femoral head projects in joint. Acute comminuted intertrochanteric fracture with displacement of lesser trochanteric fracture fragment. Cephalad migration of the distal femur. Right femoral head projects in joint. IMPRESSION: Acute comminuted and displaced intertrochanteric fracture of the right hip Electronically Signed   By: Jasmine Pang M.D.   On: 10/17/2017 00:51    Positive ROS: All other systems have been reviewed and were otherwise negative with the exception of those mentioned in the HPI and as above.  Physical Exam: General: Alert, no acute distress Cardiovascular: No pedal edema Respiratory: No cyanosis, no use of accessory musculature GI: No organomegaly, abdomen is soft and non-tender Skin: No lesions in the area of chief  complaint Neurologic: Sensation intact distally Psychiatric: Patient is competent for consent with normal mood and affect Lymphatic: No axillary or cervical lymphadenopathy  MUSCULOSKELETAL: Patient alert and fairly well oriented.  She has shortening and pain in the right leg and pain on movement of the hip.  Some external rotation.  Neurovascular status is good distally.  Skin is intact.  No other injuries are noted.  Assessment: Comminuted displaced intertrochanteric fracture right hip  Plan: Open reduction internal fixation right hip with TFN    Valinda Hoar, MD [phone]   10/17/2017 10:28 AM

## 2017-10-17 NOTE — Op Note (Signed)
DATE OF SURGERY:  10/17/2017  TIME: 1:15 PM  PATIENT NAME:  Joann [name]  AGE: 84 y.o.  PRE-OPERATIVE DIAGNOSIS:  Right Hip Fracture comminuted displaced intertrochanteric  POST-OPERATIVE DIAGNOSIS:  SAME  PROCEDURE:  INTRAMEDULLARY (IM) NAIL INTERTROCHANTRIC fracture right hip  SURGEON:  Milus Fritze E  ASST:  EBL: 25 cc  COMPLICATIONS: None  OPERATIVE IMPLANTS: Synthes trochanteric femoral nail 130 degrees / 11 mm with interlocking helical blade 100 mm and distal locking screw 34 mm.  PREOPERATIVE INDICATIONS:  Joann [name] is a 84 y.o. year old who fell and suffered a hip fracture. She was brought into the ER and then admitted and optimized and then elected for surgical intervention.    The risks benefits and alternatives were discussed with the patient including but not limited to the risks of nonoperative treatment, versus surgical intervention including infection, bleeding, nerve injury, malunion, nonunion, hardware prominence, hardware failure, need for hardware removal, blood clots, cardiopulmonary complications, morbidity, mortality, among others, and they were willing to proceed.    OPERATIVE PROCEDURE:  The patient was brought to the operating room and placed in the supine position.  Spinal anesthesia was administered, with a foley. She was placed on the fracture table.  Closed reduction was performed under C-arm guidance. The length of the femur was also measured using fluoroscopy. Time out was then performed after sterile prep and drape. She received preoperative antibiotics.  Incision was made proximal to the greater trochanter. A guidewire was placed in the appropriate position. Confirmation was made on AP and lateral views. The above-named nail was opened. I opened the proximal femur with a reamer. I then placed the nail by hand easily down. I did not need to ream the femur.  Once the nail was completely seated, I placed a guidepin into the femoral head  into the center center position through a second incision.  I measured the length, and then reamed the lateral cortex and up into the head. I then placed the helical blade. Slight compression was applied. Anatomic fixation achieved. Bone quality was mediocre.  I then secured the proximal interlock.  The distal locking screw was then placed and after confirming the position of the fracture fragments and hardware I then removed the instruments, and took final C-arm pictures AP and lateral the entire length of the leg. Anatomic reconstruction was achieved, and the wounds were irrigated copiously and closed with Vicryl  followed by staples and dry sterile dressing. Sponge and needle count were correct.   The patient was awakened and returned to PACU in stable and satisfactory condition. There no complications and the patient tolerated the procedure well.  She will be partial weightbearing as tolerated, and will be on Lovenox  For DVT prophylaxis.     Joann [name], M.D.

## 2017-10-17 NOTE — Progress Notes (Signed)
PHARMACIST - PHYSICIAN ORDER COMMUNICATION  CONCERNING: P&T Medication Policy on Herbal Medications  DESCRIPTION:  This patient's order for:  zinc  has been noted.  This product(s) is classified as an "herbal" or natural product. Due to a lack of definitive safety studies or FDA approval, nonstandard manufacturing practices, plus the potential risk of unknown drug-drug interactions while on inpatient medications, the Pharmacy and Therapeutics Committee does not permit the use of "herbal" or natural products of this type within [facility].   ACTION TAKEN: The pharmacy department is unable to verify this order at this time. Please reevaluate patient's clinical condition at discharge and address if the herbal or natural product(s) should be resumed at that time.

## 2017-10-17 NOTE — ED Provider Notes (Signed)
[facility] Emergency Department Provider Note   ____________________________________________   First MD Initiated Contact with Patient 10/16/17 2328     (approximate)  I have reviewed the triage vital signs and the nursing notes.   HISTORY  Chief Complaint Fall  The patient with history of dementia  HPI Joann SiasDolores H [name] is a 84 y.o. female who comes into the hospital today with some right hip pain.  The patient has a history of dementia and was found outside on the back porch.  The patient's family is unsure exactly how long she had been there but she was on the floor.  EMS states that her right leg is shortened and externally rotated.  The patient is also complaining of pain to her right hip.  The patient states that her pain is a 10 out of 10 in intensity.  She states that she does not know exactly how she ended up outside.  She states that she did not hit her head and she does not think she passed out but she is not sure.  The patient denies pain anywhere else and states that she does her hip.  She is here today for evaluation.  EMS did give the patient 100 mcg of fentanyl with no relief of her pain.   Past Medical History:  Diagnosis Date  . Anxiety   . Arthritis   . Chicken pox   . Colon polyp   . GERD (gastroesophageal reflux disease)   . Hyperlipidemia   . Hypertension   . Macular degeneration    [place] in [place]  . Thyroid disease     Patient Active Problem List   Diagnosis Date Noted  . Closed right hip fracture (HCC) 10/17/2017  . GERD (gastroesophageal reflux disease) 10/17/2017  . Hypothyroidism 10/17/2017  . Dementia with behavioral disturbance 12/11/2014  . Rosacea 10/04/2014  . Hallucination, visual 10/04/2014  . B12 deficiency 10/04/2014  . Macular degeneration 07/10/2013  . Screening for breast cancer 05/23/2013  . Carpal tunnel syndrome 11/21/2012  . Medicare annual wellness visit, subsequent 07/18/2012  .  Hypertension 04/06/2012  . HLD (hyperlipidemia) 04/06/2012    Past Surgical History:  Procedure Laterality Date  . ABDOMINAL HYSTERECTOMY  1971  . APPENDECTOMY  1942  . KNEE DISLOCATION SURGERY      Prior to Admission medications   Medication Sig Start Date End Date Taking? Authorizing Provider  atorvastatin (LIPITOR) 10 MG tablet Take 1 tablet (10 mg total) by mouth daily. 03/31/16   Tommie Samsook, Jayce G, DO  Cholecalciferol (VITAMIN D-3) 1000 UNITS CAPS Take 1 capsule by mouth daily.    [provider]  cyanocobalamin (,VITAMIN B-12,) 1000 MCG/ML injection Inject 1000mcg IM weekly x 3 weeks, then monthly after that. 02/22/14   Shelia MediaWalker, Jennifer A, MD  etodolac (LODINE) 500 MG tablet Take 1 tablet (500 mg total) by mouth 2 (two) times daily. 07/18/12   Shelia MediaWalker, Jennifer A, MD  levothyroxine (SYNTHROID, LEVOTHROID) 25 MCG tablet TAKE ONE TABLET BY MOUTH ONCE DAILY 01/02/16   Shelia MediaWalker, Jennifer A, MD  magnesium 30 MG tablet Take 30 mg by mouth daily.    [provider]  oxyCODONE-acetaminophen (ROXICET) 5-325 MG tablet Take 1 tablet by mouth every 4 (four) hours as needed for severe pain. 01/22/17   Darci CurrentBrown, Sevier N, MD  QUEtiapine (SEROQUEL) 25 MG tablet Take 3 tablets (75 mg total) by mouth at bedtime. 03/31/16   Tommie Samsook, Jayce G, DO  sertraline (ZOLOFT) 50 MG tablet TAKE ONE  TABLET BY MOUTH ONCE DAILY *FOLLOW  UP  WITH  PCP  FOR  MORE  REFILLS* 06/04/16   Tommie Sams, DO  Simethicone (GAS RELIEF 125 MAX ST PO) Take 1 tablet by mouth as needed.    [provider]  valsartan (DIOVAN) 160 MG tablet TAKE ONE TABLET BY MOUTH ONCE DAILY 12/25/15   Shelia Media, MD    Allergies Codeine  Family History  Problem Relation Age of Onset  . Heart disease Mother   . Hypertension Mother   . Parkinsonism Mother   . Heart disease Father   . Hypertension Father   . Cancer Father        bone    Social History Social History   Tobacco Use  . Smoking status: Current Every Day  Smoker    Types: Cigarettes  . Smokeless tobacco: Never Used  Substance Use Topics  . Alcohol use: Yes    Comment: rarely  . Drug use: No    Review of Systems  Constitutional: No fever/chills Eyes: No visual changes. ENT: No sore throat. Cardiovascular: Denies chest pain. Respiratory: Denies shortness of breath. Gastrointestinal: No abdominal pain.  No nausea, no vomiting.  No diarrhea.  No constipation. Genitourinary: Negative for dysuria. Musculoskeletal: Right hip pain Skin: Negative for rash. Neurological: Negative for headaches, focal weakness or numbness.   ____________________________________________   PHYSICAL EXAM:  VITAL SIGNS: ED Triage Vitals  Enc Vitals Group     BP 10/16/17 2346 (!) 171/85     Pulse Rate 10/16/17 2346 68     Resp 10/16/17 2346 18     Temp 10/16/17 2346 97.6 F (36.4 C)     Temp Source 10/16/17 2346 Oral     SpO2 10/16/17 2346 99 %     Weight 10/16/17 2347 140 lb (63.5 kg)     Height 10/16/17 2347 5\' 1"  (1.549 m)     Head Circumference --      Peak Flow --      Pain Score 10/16/17 2347 10     Pain Loc --      Pain Edu? --      Excl. in GC? --     Constitutional: Alert and disoriented to date well appearing and in moderate distress. Eyes: Conjunctivae are normal. PERRL. EOMI. Head: Atraumatic. Nose: No congestion/rhinnorhea. Mouth/Throat: Mucous membranes are moist.  Oropharynx non-erythematous. Neck: No cervical spine tenderness to palpation. Cardiovascular: Normal rate, regular rhythm. Grossly normal heart sounds.  Good peripheral circulation. Respiratory: Normal respiratory effort.  No retractions. Lungs CTAB. Gastrointestinal: Soft and nontender. No distention.  Positive bowel sounds Musculoskeletal: Right hip tenderness to palpation Neurologic:  Normal speech and language.  Skin:  Skin is warm, dry and intact. Psychiatric: Mood and affect are normal.  ____________________________________________   LABS (all labs  ordered are listed, but only abnormal results are displayed)  Labs Reviewed  CBC - Abnormal; Notable for the following components:      Result Value   WBC 16.4 (*)    RDW 14.7 (*)    All other components within normal limits  BASIC METABOLIC PANEL - Abnormal; Notable for the following components:   Glucose, Bld 155 (*)    All other components within normal limits  TROPONIN I   ____________________________________________  EKG  ED ECG REPORT I, Rebecka Apley, the attending physician, personally viewed and interpreted this ECG.   Date: 10/16/2017  EKG Time: 2348  Rate: 61  Rhythm: normal sinus rhythm  Axis: normal  Intervals:none  ST&T Change: Fused T wave flattening in the precordial leads flipped T waves in leads I and aVL  ____________________________________________  RADIOLOGY  ED MD interpretation:    CXR: NAD  DG right hip: Acute comminuted and displaced intertrochanteric fracture of the right hip  CT head and cervical spine: No CT evidence for acute intracranial abnormality, atrophy and mild small vessel ischemic changes of the white matter, reversal of cervical lordosis with anterolisthesis of C3 on 4 and C4 on 5, no definite acute fracture seen  Official radiology report(s): Dg Chest 1 View  Result Date: 10/17/2017 CLINICAL DATA:  Fall with hip fracture EXAM: CHEST 1 VIEW COMPARISON:  None FINDINGS: No focal pulmonary opacity or pleural effusion. Normal cardiomediastinal silhouette with aortic atherosclerosis. No pneumothorax. IMPRESSION: No active disease. Electronically Signed   By: Jasmine Pang M.D.   On: 10/17/2017 00:52   Ct Head Wo Contrast  Result Date: 10/17/2017 CLINICAL DATA:  Fall EXAM: CT HEAD WITHOUT CONTRAST CT CERVICAL SPINE WITHOUT CONTRAST TECHNIQUE: Multidetector CT imaging of the head and cervical spine was performed following the standard protocol without intravenous contrast. Multiplanar CT image reconstructions of the cervical spine  were also generated. COMPARISON:  None. FINDINGS: CT HEAD FINDINGS Brain: No acute territorial infarction, haemorrhage or intracranial mass is visualized. Moderate atrophy. Mild small vessel ischemic changes of the white matter. Vascular: No hyperdense vessels.  Carotid vascular calcification Skull: Normal. Negative for fracture or focal lesion. Sinuses/Orbits: Mild mucosal thickening in the sphenoid and ethmoid sinuses. No acute orbital abnormality Other: None CT CERVICAL SPINE FINDINGS Alignment: Reversal of cervical lordosis. Trace anterolisthesis of C3 on C4 and C4 on C5. Facet alignment within normal limits. Skull base and vertebrae: No acute fracture. No primary bone lesion or focal pathologic process. Soft tissues and spinal canal: No prevertebral fluid or swelling. No visible canal hematoma. Disc levels: Moderate degenerative changes at C5-C6 and C6-C7. Mild degenerative changes at C4-C5. Upper chest: Negative. Other: None IMPRESSION: 1. No CT evidence for acute intracranial abnormality. Atrophy and mild small vessel ischemic changes of the white matter 2. Reversal of cervical lordosis with trace anterolisthesis of C3 on C4 and C4 on C5. No definite acute fracture seen. Electronically Signed   By: Jasmine Pang M.D.   On: 10/17/2017 00:42   Ct Cervical Spine Wo Contrast  Result Date: 10/17/2017 CLINICAL DATA:  Fall EXAM: CT HEAD WITHOUT CONTRAST CT CERVICAL SPINE WITHOUT CONTRAST TECHNIQUE: Multidetector CT imaging of the head and cervical spine was performed following the standard protocol without intravenous contrast. Multiplanar CT image reconstructions of the cervical spine were also generated. COMPARISON:  None. FINDINGS: CT HEAD FINDINGS Brain: No acute territorial infarction, haemorrhage or intracranial mass is visualized. Moderate atrophy. Mild small vessel ischemic changes of the white matter. Vascular: No hyperdense vessels.  Carotid vascular calcification Skull: Normal. Negative for fracture  or focal lesion. Sinuses/Orbits: Mild mucosal thickening in the sphenoid and ethmoid sinuses. No acute orbital abnormality Other: None CT CERVICAL SPINE FINDINGS Alignment: Reversal of cervical lordosis. Trace anterolisthesis of C3 on C4 and C4 on C5. Facet alignment within normal limits. Skull base and vertebrae: No acute fracture. No primary bone lesion or focal pathologic process. Soft tissues and spinal canal: No prevertebral fluid or swelling. No visible canal hematoma. Disc levels: Moderate degenerative changes at C5-C6 and C6-C7. Mild degenerative changes at C4-C5. Upper chest: Negative. Other: None IMPRESSION: 1. No CT evidence for acute intracranial abnormality. Atrophy and mild small vessel ischemic  changes of the white matter 2. Reversal of cervical lordosis with trace anterolisthesis of C3 on C4 and C4 on C5. No definite acute fracture seen. Electronically Signed   By: Jasmine Pang M.D.   On: 10/17/2017 00:42   Dg Hip Unilat With Pelvis 2-3 Views Right  Result Date: 10/17/2017 CLINICAL DATA:  Hip pain after fall EXAM: DG HIP (WITH OR WITHOUT PELVIS) 2-3V RIGHT COMPARISON:  CT 02/07/2011 FINDINGS: SI joints are non widened. Pubic symphysis and rami are intact. Left femoral head projects in joint. Acute comminuted intertrochanteric fracture with displacement of lesser trochanteric fracture fragment. Cephalad migration of the distal femur. Right femoral head projects in joint. IMPRESSION: Acute comminuted and displaced intertrochanteric fracture of the right hip Electronically Signed   By: Jasmine Pang M.D.   On: 10/17/2017 00:51    ____________________________________________   PROCEDURES  Procedure(s) performed: None  Procedures  Critical Care performed: No  ____________________________________________   INITIAL IMPRESSION / ASSESSMENT AND PLAN / ED COURSE  As part of my medical decision making, I reviewed the following data within the electronic MEDICAL RECORD NUMBER Notes from prior  ED visits and [place] Controlled Substance Database   This is an 84 year old female who comes into the hospital today with some right hip pain after being found on her back porch.  My differential diagnosis includes hip fracture versus strain.  We did send the patient for a CT scan of her head and cervical spine since she does have dementia and does not fully recall exactly if she passed out or had any other symptoms.  I also sent the patient for right hip x-ray.  It was found that the patient has a comminuted displaced intertrochanteric fracture.  Her CT scan was unremarkable.  We also checked some blood work to include a CBC BMP and troponin which were negative.  I contacted the hospitalist as well as Dr. Hyacinth Meeker the orthopedic surgeon to admit the patient and set her up for surgery.  The patient received a dose of morphine and Zofran and her pain is improved.  She will be admitted to the hospitalist service for her hip fracture.      ____________________________________________   FINAL CLINICAL IMPRESSION(S) / ED DIAGNOSES  Final diagnoses:  Fall, initial encounter  Closed fracture of right hip, initial encounter [facility])     ED Discharge Orders    None       Note:  This document was prepared using Dragon voice recognition software and may include unintentional dictation errors.    Rebecka Apley, MD 10/17/17 0130

## 2017-10-17 NOTE — ED Notes (Signed)
Admitting MD at bedside.

## 2017-10-17 NOTE — Progress Notes (Signed)
15 minute call to floor. 

## 2017-10-17 NOTE — ED Notes (Signed)
Pt provided sips of water, per request. OK, per MD.

## 2017-10-17 NOTE — H&P (Signed)
[facility] Physicians - [place] at [facility]   PATIENT NAME: Joann [name]    MR#:  [id number]  DATE OF BIRTH:  21-Jun-1933  DATE OF ADMISSION:  10/16/2017  PRIMARY CARE PHYSICIAN: System, Pcp Not In   REQUESTING/REFERRING PHYSICIAN: Zenda Alpers, MD  CHIEF COMPLAINT:   Chief Complaint  Patient presents with  . Fall    HISTORY OF PRESENT ILLNESS:  Joann [name]  is a 84 y.o. female who presents with fall and subsequent right hip fracture.  Patient was found down on her porch by family.  Fall was unwitnessed.  Patient has dementia and does not remember how she fell.  Patient states that she does not think she lost consciousness or hit her head.  Orthopedic surgery contacted by ED physician and hospitalist called for admission  PAST MEDICAL HISTORY:   Past Medical History:  Diagnosis Date  . Anxiety   . Arthritis   . Chicken pox   . Colon polyp   . GERD (gastroesophageal reflux disease)   . Hyperlipidemia   . Hypertension   . Macular degeneration    Dr. Allyne Gee in [place]  . Thyroid disease      PAST SURGICAL HISTORY:   Past Surgical History:  Procedure Laterality Date  . ABDOMINAL HYSTERECTOMY  1971  . APPENDECTOMY  1942  . KNEE DISLOCATION SURGERY       SOCIAL HISTORY:   Social History   Tobacco Use  . Smoking status: Current Every Day Smoker    Types: Cigarettes  . Smokeless tobacco: Never Used  Substance Use Topics  . Alcohol use: Yes    Comment: rarely     FAMILY HISTORY:   Family History  Problem Relation Age of Onset  . Heart disease Mother   . Hypertension Mother   . Parkinsonism Mother   . Heart disease Father   . Hypertension Father   . Cancer Father        bone     DRUG ALLERGIES:   Allergies  Allergen Reactions  . Codeine Other (See Comments)    Paralysis     MEDICATIONS AT HOME:   Prior to Admission medications   Medication Sig Start Date End Date Taking? Authorizing Provider  atorvastatin (LIPITOR)  10 MG tablet Take 1 tablet (10 mg total) by mouth daily. 03/31/16   Tommie Sams, DO  Cholecalciferol (VITAMIN D-3) 1000 UNITS CAPS Take 1 capsule by mouth daily.    [provider]  cyanocobalamin (,VITAMIN B-12,) 1000 MCG/ML injection Inject IM weekly x 3 weeks, then monthly after that. 02/22/14   Shelia Media, MD  etodolac (LODINE) 500 MG tablet Take 1 tablet (500 mg total) by mouth 2 (two) times daily. 07/18/12   Shelia Media, MD  levothyroxine (SYNTHROID, LEVOTHROID) 25 MCG tablet TAKE ONE TABLET BY MOUTH ONCE DAILY 01/02/16   Shelia Media, MD  magnesium 30 MG tablet Take 30 mg by mouth daily.    [provider]  oxyCODONE-acetaminophen (ROXICET) 5-325 MG tablet Take 1 tablet by mouth every 4 (four) hours as needed for severe pain. 01/22/17   Darci Current, MD  QUEtiapine (SEROQUEL) 25 MG tablet Take 3 tablets (75 mg total) by mouth at bedtime. 03/31/16   Tommie Sams, DO  sertraline (ZOLOFT) 50 MG tablet TAKE ONE TABLET BY MOUTH ONCE DAILY *FOLLOW  UP  WITH  PCP  FOR  MORE  REFILLS* 06/04/16   Tommie Sams, DO  Simethicone (GAS RELIEF 125 MAX  ST PO) Take 1 tablet by mouth as needed.    [provider]  valsartan (DIOVAN) 160 MG tablet TAKE ONE TABLET BY MOUTH ONCE DAILY 12/25/15   Shelia Media, MD    REVIEW OF SYSTEMS:  Review of Systems  Constitutional: Negative for chills, fever, malaise/fatigue and weight loss.  HENT: Negative for ear pain, hearing loss and tinnitus.   Eyes: Negative for blurred vision, double vision, pain and redness.  Respiratory: Negative for cough, hemoptysis and shortness of breath.   Cardiovascular: Negative for chest pain, palpitations, orthopnea and leg swelling.  Gastrointestinal: Negative for abdominal pain, constipation, diarrhea, nausea and vomiting.  Genitourinary: Negative for dysuria, frequency and hematuria.  Musculoskeletal: Positive for falls and joint pain (Right hip). Negative for back pain  and neck pain.  Skin:       No acne, rash, or lesions  Neurological: Negative for dizziness, tremors, focal weakness and weakness.  Endo/Heme/Allergies: Negative for polydipsia. Does not bruise/bleed easily.  Psychiatric/Behavioral: Negative for depression. The patient is not nervous/anxious and does not have insomnia.      VITAL SIGNS:   Vitals:   10/16/17 2346 10/16/17 2347 10/17/17 0034 10/17/17 0105  BP: (!) 171/85  (!) 166/59 (!) 176/70  Pulse: 68  64 70  Resp: 18  12 13   Temp: 97.6 F (36.4 C)     TempSrc: Oral     SpO2: 99%  96% 96%  Weight:  63.5 kg (140 lb)    Height:  5\' 1"  (1.549 m)     Wt Readings from Last 3 Encounters:  10/16/17 63.5 kg (140 lb)  01/21/17 58.5 kg (129 lb)  03/21/15 56.8 kg (125 lb 3.2 oz)    PHYSICAL EXAMINATION:  Physical Exam  Vitals reviewed. Constitutional: She is oriented to person, place, and time. She appears well-developed and well-nourished. No distress.  HENT:  Head: Normocephalic and atraumatic.  Mouth/Throat: Oropharynx is clear and moist.  Eyes: Conjunctivae and EOM are normal. Pupils are equal, round, and reactive to light. No scleral icterus.  Neck: Normal range of motion. Neck supple. No JVD present. No thyromegaly present.  Cardiovascular: Normal rate, regular rhythm and intact distal pulses. Exam reveals no gallop and no friction rub.  No murmur heard. Respiratory: Effort normal and breath sounds normal. No respiratory distress. She has no wheezes. She has no rales.  GI: Soft. Bowel sounds are normal. She exhibits no distension. There is no tenderness.  Musculoskeletal: Normal range of motion. She exhibits tenderness (Right hip) and deformity (Right leg shortened and rotated). She exhibits no edema.  No arthritis, no gout  Lymphadenopathy:    She has no cervical adenopathy.  Neurological: She is alert and oriented to person, place, and time. No cranial nerve deficit.  No dysarthria, no aphasia  Skin: Skin is warm and dry.  No rash noted. No erythema.  Psychiatric: She has a normal mood and affect. Her behavior is normal. Judgment and thought content normal.    LABORATORY PANEL:   CBC Recent Labs  Lab 10/16/17 2355  WBC 16.4*  HGB 13.1  HCT 39.7  PLT 320   ------------------------------------------------------------------------------------------------------------------  Chemistries  Recent Labs  Lab 10/16/17 2355  NA 140  K 3.8  CL 107  CO2 24  GLUCOSE 155*  BUN 12  CREATININE 0.78  CALCIUM 9.1   ------------------------------------------------------------------------------------------------------------------  Cardiac Enzymes Recent Labs  Lab 10/16/17 2355  TROPONINI <0.03   ------------------------------------------------------------------------------------------------------------------  RADIOLOGY:  Dg Chest 1 View  Result Date: 10/17/2017 CLINICAL  DATA:  Fall with hip fracture EXAM: CHEST 1 VIEW COMPARISON:  None FINDINGS: No focal pulmonary opacity or pleural effusion. Normal cardiomediastinal silhouette with aortic atherosclerosis. No pneumothorax. IMPRESSION: No active disease. Electronically Signed   By: Jasmine PangKim  Fujinaga M.D.   On: 10/17/2017 00:52   Ct Head Wo Contrast  Result Date: 10/17/2017 CLINICAL DATA:  Fall EXAM: CT HEAD WITHOUT CONTRAST CT CERVICAL SPINE WITHOUT CONTRAST TECHNIQUE: Multidetector CT imaging of the head and cervical spine was performed following the standard protocol without intravenous contrast. Multiplanar CT image reconstructions of the cervical spine were also generated. COMPARISON:  None. FINDINGS: CT HEAD FINDINGS Brain: No acute territorial infarction, haemorrhage or intracranial mass is visualized. Moderate atrophy. Mild small vessel ischemic changes of the white matter. Vascular: No hyperdense vessels.  Carotid vascular calcification Skull: Normal. Negative for fracture or focal lesion. Sinuses/Orbits: Mild mucosal thickening in the sphenoid and ethmoid  sinuses. No acute orbital abnormality Other: None CT CERVICAL SPINE FINDINGS Alignment: Reversal of cervical lordosis. Trace anterolisthesis of C3 on C4 and C4 on C5. Facet alignment within normal limits. Skull base and vertebrae: No acute fracture. No primary bone lesion or focal pathologic process. Soft tissues and spinal canal: No prevertebral fluid or swelling. No visible canal hematoma. Disc levels: Moderate degenerative changes at C5-C6 and C6-C7. Mild degenerative changes at C4-C5. Upper chest: Negative. Other: None IMPRESSION: 1. No CT evidence for acute intracranial abnormality. Atrophy and mild small vessel ischemic changes of the white matter 2. Reversal of cervical lordosis with trace anterolisthesis of C3 on C4 and C4 on C5. No definite acute fracture seen. Electronically Signed   By: Jasmine PangKim  Fujinaga M.D.   On: 10/17/2017 00:42   Ct Cervical Spine Wo Contrast  Result Date: 10/17/2017 CLINICAL DATA:  Fall EXAM: CT HEAD WITHOUT CONTRAST CT CERVICAL SPINE WITHOUT CONTRAST TECHNIQUE: Multidetector CT imaging of the head and cervical spine was performed following the standard protocol without intravenous contrast. Multiplanar CT image reconstructions of the cervical spine were also generated. COMPARISON:  None. FINDINGS: CT HEAD FINDINGS Brain: No acute territorial infarction, haemorrhage or intracranial mass is visualized. Moderate atrophy. Mild small vessel ischemic changes of the white matter. Vascular: No hyperdense vessels.  Carotid vascular calcification Skull: Normal. Negative for fracture or focal lesion. Sinuses/Orbits: Mild mucosal thickening in the sphenoid and ethmoid sinuses. No acute orbital abnormality Other: None CT CERVICAL SPINE FINDINGS Alignment: Reversal of cervical lordosis. Trace anterolisthesis of C3 on C4 and C4 on C5. Facet alignment within normal limits. Skull base and vertebrae: No acute fracture. No primary bone lesion or focal pathologic process. Soft tissues and spinal canal:  No prevertebral fluid or swelling. No visible canal hematoma. Disc levels: Moderate degenerative changes at C5-C6 and C6-C7. Mild degenerative changes at C4-C5. Upper chest: Negative. Other: None IMPRESSION: 1. No CT evidence for acute intracranial abnormality. Atrophy and mild small vessel ischemic changes of the white matter 2. Reversal of cervical lordosis with trace anterolisthesis of C3 on C4 and C4 on C5. No definite acute fracture seen. Electronically Signed   By: Jasmine PangKim  Fujinaga M.D.   On: 10/17/2017 00:42   Dg Hip Unilat With Pelvis 2-3 Views Right  Result Date: 10/17/2017 CLINICAL DATA:  Hip pain after fall EXAM: DG HIP (WITH OR WITHOUT PELVIS) 2-3V RIGHT COMPARISON:  CT 02/07/2011 FINDINGS: SI joints are non widened. Pubic symphysis and rami are intact. Left femoral head projects in joint. Acute comminuted intertrochanteric fracture with displacement of lesser trochanteric fracture fragment. Cephalad migration of  the distal femur. Right femoral head projects in joint. IMPRESSION: Acute comminuted and displaced intertrochanteric fracture of the right hip Electronically Signed   By: Jasmine Pang M.D.   On: 10/17/2017 00:51    EKG:   Orders placed or performed during the hospital encounter of 10/16/17  . ED EKG  . ED EKG  . EKG 12-Lead  . EKG 12-Lead  . EKG 12-Lead  . EKG 12-Lead    IMPRESSION AND PLAN:  Principal Problem:   Closed right hip fracture (HCC) -PRN analgesia, orthopedic surgery consult, defer to their recommendation for treatment of this problem including likely surgical repair.  Cardiac risk stratification as below, patient does not have any known risk factors.   Active Problems:   Hypertension -continue home meds   HLD (hyperlipidemia) -home dose antilipid   Dementia with behavioral disturbance -continue home medications   Hypothyroidism -home dose thyroid replacement  Chart review performed and case discussed with ED provider. Labs, imaging and/or ECG reviewed by  provider and discussed with patient/family. Management plans discussed with the patient and/or family.  DVT PROPHYLAXIS: Mechanical only  GI PROPHYLAXIS: None  ADMISSION STATUS: Inpatient  CODE STATUS: Full Code Status History    This patient does not have a recorded code status. Please follow your organizational policy for patients in this situation.    Advance Directive Documentation     Most Recent Value  Type of Advance Directive  Healthcare Power of Attorney  Pre-existing out of facility DNR order (yellow form or pink MOST form)  No data  "MOST" Form in Place?  No data      TOTAL TIME TAKING CARE OF THIS PATIENT: 45 minutes.   Joann [name] FIELDING 10/17/2017, 1:33 AM  [employer]  [phone]  CC: Primary care physician; System, Pcp Not In  Note:  This document was prepared using Dragon voice recognition software and may include unintentional dictation errors.

## 2017-10-17 NOTE — Transfer of Care (Signed)
Immediate Anesthesia Transfer of Care Note  Patient: Joann [name]  Procedure(s) Performed: INTRAMEDULLARY (IM) NAIL INTERTROCHANTRIC (Right )  Patient Location: PACU  Anesthesia Type:Spinal  Level of Consciousness: awake and alert   Airway & Oxygen Therapy: Patient Spontanous Breathing and Patient connected to face mask oxygen  Post-op Assessment: Report given to RN and Post -op Vital signs reviewed and stable  Post vital signs: Reviewed  Last Vitals:  Vitals:   10/17/17 0710 10/17/17 1319  BP: (!) 148/54 (!) 128/59  Pulse: 71 95  Resp: 18 12  Temp: 37 C 37.8 C  SpO2: 93% 100%    Last Pain:  Vitals:   10/17/17 0710  TempSrc: Oral  PainSc:          Complications: No apparent anesthesia complications

## 2017-10-17 NOTE — Anesthesia Preprocedure Evaluation (Signed)
Anesthesia Evaluation  Patient identified by MRN, date of birth, ID band Patient awake    Reviewed: Allergy & Precautions, H&P , NPO status , Patient's Chart, lab work & pertinent test results, reviewed documented beta blocker date and time   Airway Mallampati: II   Neck ROM: full    Dental  (+) Poor Dentition   Pulmonary neg pulmonary ROS, Current Smoker,    Pulmonary exam normal        Cardiovascular Exercise Tolerance: Poor hypertension, On Medications + Peripheral Vascular Disease  negative cardio ROS Normal cardiovascular exam Rhythm:regular Rate:Normal     Neuro/Psych  Neuromuscular disease negative neurological ROS  negative psych ROS   GI/Hepatic negative GI ROS, Neg liver ROS, GERD  Medicated,  Endo/Other  negative endocrine ROSHypothyroidism   Renal/GU negative Renal ROS  negative genitourinary   Musculoskeletal   Abdominal   Peds  Hematology negative hematology ROS (+)   Anesthesia Other Findings Past Medical History: No date: Anxiety No date: Arthritis No date: Chicken pox No date: Colon polyp No date: GERD (gastroesophageal reflux disease) No date: Hyperlipidemia No date: Hypertension No date: Macular degeneration     Comment:  [place]. [place] in [place] No date: Thyroid disease Past Surgical History: 1971: ABDOMINAL HYSTERECTOMY 1942: APPENDECTOMY No date: KNEE DISLOCATION SURGERY BMI    Body Mass Index:  26.45 kg/m     Reproductive/Obstetrics negative OB ROS                             Anesthesia Physical Anesthesia Plan  ASA: III and emergent  Anesthesia Plan: General   Post-op Pain Management:    Induction:   PONV Risk Score and Plan:   Airway Management Planned:   Additional Equipment:   Intra-op Plan:   Post-operative Plan:   Informed Consent: I have reviewed the patients History and Physical, chart, labs and discussed the procedure  including the risks, benefits and alternatives for the proposed anesthesia with the patient or authorized representative who has indicated his/her understanding and acceptance.   Dental Advisory Given  Plan Discussed with: CRNA  Anesthesia Plan Comments:         Anesthesia Quick Evaluation

## 2017-10-17 NOTE — Progress Notes (Signed)
Po cola given to patient to drink.

## 2017-10-17 NOTE — Anesthesia Procedure Notes (Signed)
Spinal  Patient location during procedure: OR Staffing Anesthesiologist: Adams, James G, MD Resident/CRNA: Merly Hinkson, CRNA Performed: resident/CRNA  Preanesthetic Checklist Completed: patient identified, site marked, surgical consent, pre-op evaluation, timeout performed, IV checked, risks and benefits discussed and monitors and equipment checked Spinal Block Patient position: sitting Prep: ChloraPrep and site prepped and draped Patient monitoring: heart rate, continuous pulse ox, blood pressure and cardiac monitor Approach: midline Location: L4-5 Injection technique: single-shot Needle Needle type: Introducer and Pencan  Needle gauge: 24 G Needle length: 9 cm Additional Notes Negative paresthesia. Negative blood return. Positive free-flowing CSF. Expiration date of kit checked and confirmed. Patient tolerated procedure well, without complications.       

## 2017-10-17 NOTE — Clinical Social Work Note (Signed)
Clinical Social Work Assessment  Patient Details  Name: Joann [name] MRN: [id number] Date of Birth: 1933/05/19  Date of referral:  10/17/17               Reason for consult:  Facility Placement                Permission sought to share information with:  Chartered certified accountant granted to share information::  Yes, Verbal Permission Granted  Name::        Agency::  [facility] area SNFs  Relationship::     Contact Information:     Housing/Transportation Living arrangements for the past 2 months:  [place] of Information:  Patient, Medical Team Patient Interpreter Needed:  None Criminal Activity/Legal Involvement Pertinent to Current Situation/Hospitalization:  No - Comment as needed Significant Relationships:  Adult Children, Warehouse manager, Spouse Lives with:  Adult Children, Spouse Do you feel safe going back to the place where you live?  Yes Need for family participation in patient care:  No (Coment)  Care giving concerns:  Patient has hip fx and possible SNF needs   Social Worker assessment / plan:  CSW met with the patient at bedside to discuss discharge planning. The patient reported that she would prefer to discharge home where she lives with her husband and her daughter; however, she gave verbal permission to make the referral for SNF in case PT recommends such. PT is pending. The patient stated that her preference is [facility]. The CSW explained the referral process and that her insurance company would need to provide prior authorization for SNF. The patient indicated understanding.  The CSW will provide bed offers pending PT recommendations. The CSW is following.  Employment status:  Retired Nurse, adult PT Recommendations:  Not assessed at this time Information / Referral to community resources:  [place]  Patient/Family's Response to care:  The patient thanked the  CSW.  Patient/Family's Understanding of and Emotional Response to Diagnosis, Current Treatment, and Prognosis:  The patient understands that PT may recommend STR.  Emotional Assessment Appearance:  Appears stated age Attitude/Demeanor/Rapport:  Engaged, Self-Confident, Charismatic Affect (typically observed):  Appropriate, Pleasant Orientation:  Oriented to Self, Oriented to Place, Oriented to  Time, Oriented to Situation Alcohol / Substance use:  Never Used Psych involvement (Current and /or in the community):  No (Comment)  Discharge Needs  Concerns to be addressed:  Care Coordination, Discharge Planning Concerns Readmission within the last 30 days:  No Current discharge risk:  None Barriers to Discharge:  Continued Medical Work up   [employer], LCSW 10/17/2017, 4:27 PM

## 2017-10-17 NOTE — ED Notes (Signed)
Patient transported to CT 

## 2017-10-17 NOTE — Progress Notes (Signed)
[facility] [place] [facility]   PATIENT NAME: Joann [name]    MR#:  [id number]  DATE OF BIRTH:  Jul 31, 1933  SUBJECTIVE:   Late Entry Note as pt. Was seen earlier today.   Pt. Here after a fall noted to have right hip fracture.  Patient's daughter was at bedside, patient awaiting to go to operating room.  REVIEW OF SYSTEMS:    Review of Systems  Unable to perform ROS: Dementia    Nutrition: NPO Tolerating Diet: No Awaiting surgery Tolerating PT: Await Eval.   DRUG ALLERGIES:   Allergies  Allergen Reactions  . Codeine Other (See Comments)    Paralysis     VITALS:  Blood pressure (!) 103/49, pulse 74, temperature 98 F (36.7 C), temperature source Oral, resp. rate 18, height 5\' 1"  (1.549 m), weight 63.5 kg (140 lb), SpO2 95 %.  PHYSICAL EXAMINATION:   Physical Exam  GENERAL:  84 y.o.-year-old patient lying in bed in no acute distress.  EYES: Pupils equal, round, reactive to light and accommodation. No scleral icterus. Extraocular muscles intact.  HEENT: Head atraumatic, normocephalic. Oropharynx and nasopharynx clear.  NECK:  Supple, no jugular venous distention. No thyroid enlargement, no tenderness.  LUNGS: Normal breath sounds bilaterally, no wheezing, rales, rhonchi. No use of accessory muscles of respiration.  CARDIOVASCULAR: S1, S2 normal. No murmurs, rubs, or gallops.  ABDOMEN: Soft, nontender, nondistended. Bowel sounds present. No organomegaly or mass.  EXTREMITIES: No cyanosis, clubbing or edema b/l.  Patient's right hip is in a immobilizer. NEUROLOGIC: Cranial nerves II through XII are intact. No focal Motor or sensory deficits b/l.   PSYCHIATRIC: The patient is alert and oriented x 1.  SKIN: No obvious rash, lesion, or ulcer.    LABORATORY PANEL:   CBC Recent Labs  Lab 10/17/17 1520  WBC 12.2*  HGB 10.8*  HCT 32.7*  PLT 292    ------------------------------------------------------------------------------------------------------------------  Chemistries  Recent Labs  Lab 10/16/17 2355 10/17/17 1520  NA 140  --   K 3.8  --   CL 107  --   CO2 24  --   GLUCOSE 155*  --   BUN 12  --   CREATININE 0.78 0.86  CALCIUM 9.1  --    ------------------------------------------------------------------------------------------------------------------  Cardiac Enzymes Recent Labs  Lab 10/16/17 2355  TROPONINI <0.03   ------------------------------------------------------------------------------------------------------------------  RADIOLOGY:  Dg Chest 1 View  Result Date: 10/17/2017 CLINICAL DATA:  Fall with hip fracture EXAM: CHEST 1 VIEW COMPARISON:  None FINDINGS: No focal pulmonary opacity or pleural effusion. Normal cardiomediastinal silhouette with aortic atherosclerosis. No pneumothorax. IMPRESSION: No active disease. Electronically Signed   By: Jasmine PangKim  Fujinaga M.D.   On: 10/17/2017 00:52   Ct Head Wo Contrast  Result Date: 10/17/2017 CLINICAL DATA:  Fall EXAM: CT HEAD WITHOUT CONTRAST CT CERVICAL SPINE WITHOUT CONTRAST TECHNIQUE: Multidetector CT imaging of the head and cervical spine was performed following the standard protocol without intravenous contrast. Multiplanar CT image reconstructions of the cervical spine were also generated. COMPARISON:  None. FINDINGS: CT HEAD FINDINGS Brain: No acute territorial infarction, haemorrhage or intracranial mass is visualized. Moderate atrophy. Mild small vessel ischemic changes of the white matter. Vascular: No hyperdense vessels.  Carotid vascular calcification Skull: Normal. Negative for fracture or focal lesion. Sinuses/Orbits: Mild mucosal thickening in the sphenoid and ethmoid sinuses. No acute orbital abnormality Other: None CT CERVICAL SPINE FINDINGS Alignment: Reversal of cervical lordosis. Trace anterolisthesis of C3 on C4 and C4 on C5. Facet alignment within  normal limits. Skull base and vertebrae: No acute fracture. No primary bone lesion or focal pathologic process. Soft tissues and spinal canal: No prevertebral fluid or swelling. No visible canal hematoma. Disc levels: Moderate degenerative changes at C5-C6 and C6-C7. Mild degenerative changes at C4-C5. Upper chest: Negative. Other: None IMPRESSION: 1. No CT evidence for acute intracranial abnormality. Atrophy and mild small vessel ischemic changes of the white matter 2. Reversal of cervical lordosis with trace anterolisthesis of C3 on C4 and C4 on C5. No definite acute fracture seen. Electronically Signed   By: Jasmine Pang M.D.   On: 10/17/2017 00:42   Ct Cervical Spine Wo Contrast  Result Date: 10/17/2017 CLINICAL DATA:  Fall EXAM: CT HEAD WITHOUT CONTRAST CT CERVICAL SPINE WITHOUT CONTRAST TECHNIQUE: Multidetector CT imaging of the head and cervical spine was performed following the standard protocol without intravenous contrast. Multiplanar CT image reconstructions of the cervical spine were also generated. COMPARISON:  None. FINDINGS: CT HEAD FINDINGS Brain: No acute territorial infarction, haemorrhage or intracranial mass is visualized. Moderate atrophy. Mild small vessel ischemic changes of the white matter. Vascular: No hyperdense vessels.  Carotid vascular calcification Skull: Normal. Negative for fracture or focal lesion. Sinuses/Orbits: Mild mucosal thickening in the sphenoid and ethmoid sinuses. No acute orbital abnormality Other: None CT CERVICAL SPINE FINDINGS Alignment: Reversal of cervical lordosis. Trace anterolisthesis of C3 on C4 and C4 on C5. Facet alignment within normal limits. Skull base and vertebrae: No acute fracture. No primary bone lesion or focal pathologic process. Soft tissues and spinal canal: No prevertebral fluid or swelling. No visible canal hematoma. Disc levels: Moderate degenerative changes at C5-C6 and C6-C7. Mild degenerative changes at C4-C5. Upper chest: Negative.  Other: None IMPRESSION: 1. No CT evidence for acute intracranial abnormality. Atrophy and mild small vessel ischemic changes of the white matter 2. Reversal of cervical lordosis with trace anterolisthesis of C3 on C4 and C4 on C5. No definite acute fracture seen. Electronically Signed   By: Jasmine Pang M.D.   On: 10/17/2017 00:42   Dg Hip Operative Unilat W Or W/o Pelvis Right  Result Date: 10/17/2017 CLINICAL DATA:  Right hip fixation EXAM: OPERATIVE RIGHT HIP (WITH PELVIS IF PERFORMED) 3 VIEWS TECHNIQUE: Fluoroscopic spot image(s) were submitted for interpretation post-operatively. COMPARISON:  10/17/2017 FINDINGS: Internal fixation across the right femoral intertrochanteric fracture. Near anatomic alignment. No hardware complicating feature. IMPRESSION: Internal fixation with near anatomic alignment. Electronically Signed   By: Charlett Nose M.D.   On: 10/17/2017 13:39   Dg Hip Unilat With Pelvis 2-3 Views Right  Result Date: 10/17/2017 CLINICAL DATA:  Hip pain after fall EXAM: DG HIP (WITH OR WITHOUT PELVIS) 2-3V RIGHT COMPARISON:  CT 02/07/2011 FINDINGS: SI joints are non widened. Pubic symphysis and rami are intact. Left femoral head projects in joint. Acute comminuted intertrochanteric fracture with displacement of lesser trochanteric fracture fragment. Cephalad migration of the distal femur. Right femoral head projects in joint. IMPRESSION: Acute comminuted and displaced intertrochanteric fracture of the right hip Electronically Signed   By: Jasmine Pang M.D.   On: 10/17/2017 00:51     ASSESSMENT AND PLAN:   84 year old female with past medical history of hypertension, hyperlipidemia, macular degeneration, GERD, anxiety, osteoarthritis who presents to the hospital after a mechanical fall and noted to have a right hip fracture.  1.  Preoperative cardiovascular examination-patient is a low risk for noncardiac surgery. -No absolute contraindications to surgery at this time.  Patient to go to  the OR later today. -  Continue further care as per orthopedics.  2.  Status post fall with right hip fracture-patient to go to the OR today for hip surgery.  Continue further care as per orthopedics.  3.  History of dementia- high risk for sundowning.  Continue Aricept, Remeron, Seroquel. -Avoid benzodiazepines, used Haldol as needed for agitation.  4.  Hyperlipidemia-continue atorvastatin.  5.  Hypothyroidism-continue Synthroid.  6.  Depression-continue Zoloft.     All the records are reviewed and case discussed with Care Management/Social Worker. Management plans discussed with the patient, family and they are in agreement.  CODE STATUS: Full code  DVT Prophylaxis: Lovenox postoperatively  TOTAL TIME TAKING CARE OF THIS PATIENT: 30 minutes.   POSSIBLE D/C IN 2-3 DAYS, DEPENDING ON CLINICAL CONDITION.   Houston Siren M.D on 10/17/2017 at 9:09 PM  Between 7am to 6pm - Pager - [phone]  After 6pm go to [email or web address] - Social research officer, government  [facility]  Office  [phone]  CC: Primary care physician; System, Pcp Not In

## 2017-10-17 NOTE — Anesthesia Post-op Follow-up Note (Signed)
Anesthesia QCDR form completed.        

## 2017-10-17 NOTE — [place] FL2 (Signed)
[place] MEDICAID FL2 LEVEL OF CARE SCREENING TOOL     IDENTIFICATION  Patient Name: Joann [name] Birthdate: [birth year] Sex: female Admission Date (Current Location): 10/16/2017  [place]   Facility and Address:  [facility], [address], [place], [place] [address]      Provider Number: [id number]  Attending Physician Name and Address:  Houston SirenSainani, Vivek J, MD  Relative Name and Phone Number:  Rosanne GuttingLisa Sutton (Daughter) [phone]    Current Level of Care: Hospital Recommended Level of Care: Skilled Nursing Facility Prior Approval Number:    Date Approved/Denied:   PASRR Number: [id number] [id number]  Discharge Plan: SNF    Current Diagnoses: Patient Active Problem List   Diagnosis Date Noted  . Closed right hip fracture (HCC) 10/17/2017  . GERD (gastroesophageal reflux disease) 10/17/2017  . Hypothyroidism 10/17/2017  . Dementia with behavioral disturbance 12/11/2014  . Rosacea 10/04/2014  . Hallucination, visual 10/04/2014  . B12 deficiency 10/04/2014  . Macular degeneration 07/10/2013  . Screening for breast cancer 05/23/2013  . Carpal tunnel syndrome 11/21/2012  . Medicare annual wellness visit, subsequent 07/18/2012  . Hypertension 04/06/2012  . HLD (hyperlipidemia) 04/06/2012    Orientation RESPIRATION BLADDER Height & Weight     Self, Time, Situation, Place  Normal Continent Weight: 140 lb (63.5 kg) Height:  5\' 1"  (154.9 cm)  BEHAVIORAL SYMPTOMS/MOOD NEUROLOGICAL BOWEL NUTRITION STATUS      Continent    AMBULATORY STATUS COMMUNICATION OF NEEDS Skin   Extensive Assist Verbally Surgical wounds                       Personal Care Assistance Level of Assistance  Bathing, Feeding, Dressing Bathing Assistance: Limited assistance Feeding assistance: Independent Dressing Assistance: Limited assistance     Functional Limitations Info  Sight, Hearing, Speech Sight Info: Adequate Hearing Info:  Adequate Speech Info: Adequate    SPECIAL CARE FACTORS FREQUENCY  PT (By licensed PT)     PT Frequency: Up to 5/week              Contractures Contractures Info: Not present    Additional Factors Info  Code Status, Allergies Code Status Info: Full Allergies Info: Codeine           Current Medications (10/17/2017):  This is the current hospital active medication list Current Facility-Administered Medications  Medication Dose Route Frequency Provider Last Rate Last Dose  . 0.45 % sodium chloride infusion   Intravenous Continuous Deeann SaintMiller, Howard, MD 75 mL/hr at 10/17/17 1440    . 0.9 %  sodium chloride infusion   Intravenous Continuous Deeann SaintMiller, Howard, MD 75 mL/hr at 10/17/17 [phone]    . acetaminophen (TYLENOL) tablet 650 mg  650 mg Oral Q6H PRN Oralia ManisWillis, David, MD       Or  . acetaminophen (TYLENOL) suppository 650 mg  650 mg Rectal Q6H PRN Oralia ManisWillis, David, MD      . alum & mag hydroxide-simeth (MAALOX/MYLANTA) 200-200-20 MG/5ML suspension 30 mL  30 mL Oral Q4H PRN Deeann SaintMiller, Howard, MD      . atorvastatin (LIPITOR) tablet 10 mg  10 mg Oral Daily Oralia ManisWillis, David, MD      . bisacodyl (DULCOLAX) suppository 10 mg  10 mg Rectal Daily PRN Deeann SaintMiller, Howard, MD      . ceFAZolin (ANCEF) IVPB 2g/100 mL premix  2 g Intravenous Q8H Deeann SaintMiller, Howard, MD      . Chlorhexidine Gluconate Cloth 2 % PADS 6 each  6 each Topical Daily Sainani, Rolly Pancake, MD      . clindamycin (CLEOCIN) IVPB 600 mg  600 mg Intravenous Epimenio Foot, MD      . docusate sodium (COLACE) capsule 100 mg  100 mg Oral BID Deeann Saint, MD      . donepezil (ARICEPT) tablet 10 mg  10 mg Oral Mariel Kansky, MD      . Melene Muller ON 10/18/2017] enoxaparin (LOVENOX) injection 30 mg  30 mg Subcutaneous Q24H Deeann Saint, MD      . Melene Muller ON 10/18/2017] ferrous sulfate tablet 325 mg  325 mg Oral Q breakfast Deeann Saint, MD      . folic acid (FOLVITE) tablet 1 mg  1 mg Oral Daily Deeann Saint, MD      . hydrALAZINE (APRESOLINE)  injection 10 mg  10 mg Intravenous Q4H PRN Oralia Manis, MD      . irbesartan Evlyn Kanner) tablet 150 mg  150 mg Oral Daily Oralia Manis, MD      . levothyroxine (SYNTHROID, LEVOTHROID) tablet 25 mcg  25 mcg Oral QAC breakfast Oralia Manis, MD      . lip balm (BLISTEX) ointment   Topical PRN Arnaldo Natal, MD      . magnesium hydroxide (MILK OF MAGNESIA) suspension 30 mL  30 mL Oral Daily PRN Deeann Saint, MD      . menthol-cetylpyridinium (CEPACOL) lozenge 3 mg  1 lozenge Oral PRN Deeann Saint, MD       Or  . phenol (CHLORASEPTIC) mouth spray 1 spray  1 spray Mouth/Throat PRN Deeann Saint, MD      . metoCLOPramide (REGLAN) tablet 5-10 mg  5-10 mg Oral Q8H PRN Deeann Saint, MD       Or  . metoCLOPramide (REGLAN) injection 5-10 mg  5-10 mg Intravenous Q8H PRN Deeann Saint, MD      . mirtazapine (REMERON) tablet 7.5 mg  7.5 mg Oral QHS Deeann Saint, MD      . morphine 2 MG/ML injection 1 mg  1 mg Intravenous Q1H PRN Deeann Saint, MD      . morphine 2 MG/ML injection 2 mg  2 mg Intravenous Q4H PRN Oralia Manis, MD   2 mg at 10/17/17 6045  . mupirocin ointment (BACTROBAN) 2 % 1 application  1 application Nasal BID Houston Siren, MD      . ondansetron [facility]) tablet 4 mg  4 mg Oral Q6H PRN Oralia Manis, MD       Or  . ondansetron [facility]) injection 4 mg  4 mg Intravenous Q6H PRN Oralia Manis, MD      . ondansetron [facility]) tablet 4 mg  4 mg Oral Q6H PRN Deeann Saint, MD       Or  . ondansetron [facility]) injection 4 mg  4 mg Intravenous Q6H PRN Deeann Saint, MD      . oxyCODONE-acetaminophen (PERCOCET/ROXICET) 5-325 MG per tablet 1 tablet  1 tablet Oral Q4H PRN Oralia Manis, MD      . QUEtiapine (SEROQUEL) tablet 100 mg  100 mg Oral QHS Deeann Saint, MD      . sertraline (ZOLOFT) tablet 50 mg  50 mg Oral Daily Oralia Manis, MD      . sodium phosphate (FLEET) 7-19 GM/118ML enema 1 enema  1 enema Rectal Once PRN Deeann Saint, MD      . zolpidem Remus Loffler) tablet 5 mg   5 mg Oral QHS PRN Deeann Saint, MD  Discharge Medications: Please see discharge summary for a list of discharge medications.  Relevant Imaging Results:  Relevant Lab Results:   Additional Information SS# [id number]  Judi Cong, LCSW

## 2017-10-17 NOTE — ED Notes (Signed)
Patient being taken up by Allayne StackJann, EDT

## 2017-10-17 NOTE — ED Notes (Signed)
Pt back in room from CT scan. Family at bedside.

## 2017-10-18 ENCOUNTER — Encounter: Payer: Self-pay | Admitting: Specialist

## 2017-10-18 LAB — BASIC METABOLIC PANEL
Anion gap: 7 (ref 5–15)
BUN: 10 mg/dL (ref 6–20)
CALCIUM: 8.2 mg/dL — AB (ref 8.9–10.3)
CO2: 23 mmol/L (ref 22–32)
CREATININE: 0.75 mg/dL (ref 0.44–1.00)
Chloride: 105 mmol/L (ref 101–111)
GFR calc Af Amer: >60 mL/min (ref >60)
GLUCOSE: 151 mg/dL — AB (ref 65–99)
Potassium: 3.3 mmol/L — ABNORMAL LOW (ref 3.5–5.1)
Sodium: 135 mmol/L (ref 135–145)

## 2017-10-18 LAB — CBC
HCT: 26.9 % — ABNORMAL LOW (ref 35.0–47.0)
Hemoglobin: 9.2 g/dL — ABNORMAL LOW (ref 12.0–16.0)
MCH: 30.6 pg (ref 26.0–34.0)
MCHC: 34.3 g/dL (ref 32.0–36.0)
MCV: 89.3 fL (ref 80.0–100.0)
Platelets: 216 10*3/uL (ref 150–440)
RBC: 3.01 MIL/uL — ABNORMAL LOW (ref 3.80–5.20)
RDW: 14.5 % (ref 11.5–14.5)
WBC: 6.2 10*3/uL (ref 3.6–11.0)

## 2017-10-18 MED ORDER — ENOXAPARIN SODIUM 40 MG/0.4ML ~~LOC~~ SOLN
40.0000 mg | SUBCUTANEOUS | Status: DC
  Administered 2017-10-19: 40 mg via SUBCUTANEOUS
  Filled 2017-10-18: qty 0.4

## 2017-10-18 NOTE — Progress Notes (Signed)
Subjective: Pain is better controlled but with patient was unwilling to get out of bed today.  He is alert and cooperative oriented.  Hemoglobin 9.2.  We will watch this.  1 Day Post-Op Procedure(s) (LRB): INTRAMEDULLARY (IM) NAIL INTERTROCHANTRIC (Right)    Patient reports pain as moderate.  Objective:   VITALS:   Vitals:   10/18/17 1308 10/18/17 1616  BP: 102/86 (!) 105/40  Pulse: 84 75  Resp:    Temp: 98.3 F (36.8 C) 97.9 F (36.6 C)  SpO2: 96% 93%    Neurologically intact ABD soft Neurovascular intact Sensation intact distally Intact pulses distally Dorsiflexion/Plantar flexion intact Incision: scant drainage  LABS Recent Labs    10/16/17 2355 10/17/17 1520 10/18/17 0952  HGB 13.1 10.8* 9.2*  HCT 39.7 32.7* 26.9*  WBC 16.4* 12.2* 6.2  PLT 320 292 216    Recent Labs    10/16/17 2355 10/17/17 1520 10/18/17 0952  NA 140  --  135  K 3.8  --  3.3*  BUN 12  --  10  CREATININE 0.78 0.86 0.75  GLUCOSE 155*  --  151*    Recent Labs    10/16/17 2355  INR 1.03     Assessment/Plan: 1 Day Post-Op Procedure(s) (LRB): INTRAMEDULLARY (IM) NAIL INTERTROCHANTRIC (Right)   Advance diet Up with therapy D/C IV fluids Discharge to SNF   EC ASA  325mg   Bid for 6 weeks for DVT  RTC  2 weeks

## 2017-10-18 NOTE — Progress Notes (Signed)
[facility] SNF authorization has been received, authorization # [id number]. Patient's daughter [name] is aware of above. [facility] is aware of above.   [employer] Hughes IncorporatedBailey Aniah Pauli, LCSW [phone]

## 2017-10-18 NOTE — Progress Notes (Signed)
lovenox dose increase to 40mg  daily due to crcl >9530ml/hr and TBW >45kg per protocol  [employer]ef [employer] D [place] Yarbro, Pharm.D, BCPS Clinical Pharmacist

## 2017-10-18 NOTE — Clinical Social Work Placement (Addendum)
   CLINICAL SOCIAL WORK PLACEMENT  NOTE  Date:  10/18/2017  Patient Details  Name: Joann [name] MRN: [id number] Date of Birth: Jan 24, 1933  Clinical Social Work is seeking post-discharge placement for this patient at the Skilled  Nursing Facility level of care (*CSW will initial, date and re-position this form in  chart as items are completed):  Yes   Patient/family provided with [facility] Clinical Social Work Department's list of facilities offering this level of care within the geographic area requested by the patient (or if unable, by the patient's family).  Yes   Patient/family informed of their freedom to choose among providers that offer the needed level of care, that participate in Medicare, Medicaid or managed care program needed by the patient, have an available bed and are willing to accept the patient.  Yes   Patient/family informed of [facility]'s ownership interest in [facility] and [facility], as well as of the fact that they are under no obligation to receive care at these facilities.  PASRR submitted to EDS on 10/17/17     PASRR number received on 10/17/17     Existing PASRR number confirmed on       FL2 transmitted to all facilities in geographic area requested by pt/family on 10/17/17     FL2 transmitted to all facilities within larger geographic area on       Patient informed that his/her managed care company has contracts with or will negotiate with certain facilities, including the following:        Yes   Patient/family informed of bed offers received.  Patient chooses bed at [name]     Physician recommends and patient chooses bed at      [facility] to [name] on  10-19-17.  Patient [facility] be [facility] facility [facility]  [name]     Patient family notified on  10-19-17 of transfer.  Name of family member notified:   Family was at bedside.     PHYSICIAN       Additional Comment:     _______________________________________________ Sample, Darleen CrockerBailey M, LCSW 10/18/2017, 11:30 AM   Ervin KnackEric R. Vasil Juhasz, MSW, Theresia MajorsLCSWA [phone]  10/19/2017 12:02 PM Updated

## 2017-10-18 NOTE — Progress Notes (Signed)
[facility] [employer] - [place] [facility]   PATIENT NAME: Joann DacostaDolores [name]    MR#:  [id number]  DATE OF BIRTH:  July 24, 1933  SUBJECTIVE:   Patient is status post right hip intramedullary pinning. Postop day #1 today. Family at bedside. No other acute events overnight.  A bit confused which is her baseline.    REVIEW OF SYSTEMS:    Review of Systems  Constitutional: Negative for chills and fever.  HENT: Negative for congestion and tinnitus.   Eyes: Negative for blurred vision and double vision.  Respiratory: Negative for cough, shortness of breath and wheezing.   Cardiovascular: Negative for chest pain, orthopnea and PND.  Gastrointestinal: Negative for abdominal pain, diarrhea, nausea and vomiting.  Genitourinary: Negative for dysuria and hematuria.  Musculoskeletal: Positive for joint pain (right Hip).  Neurological: Negative for dizziness, sensory change and focal weakness.  All other systems reviewed and are negative.   Nutrition: Regular Tolerating Diet: Yes Tolerating PT: Eval noted.   DRUG ALLERGIES:   Allergies  Allergen Reactions  . Codeine Other (See Comments)    Paralysis     VITALS:  Blood pressure 102/86, pulse 84, temperature 98.3 F (36.8 C), temperature source Oral, resp. rate 20, height 5\' 1"  (1.549 m), weight 63.5 kg (140 lb), SpO2 96 %.  PHYSICAL EXAMINATION:   Physical Exam  GENERAL:  84 y.o.-year-old patient lying in bed in no acute distress.  EYES: Pupils equal, round, reactive to light and accommodation. No scleral icterus. Extraocular muscles intact.  HEENT: Head atraumatic, normocephalic. Oropharynx and nasopharynx clear.  NECK:  Supple, no jugular venous distention. No thyroid enlargement, no tenderness.  LUNGS: Normal breath sounds bilaterally, no wheezing, rales, rhonchi. No use of accessory muscles of respiration.  CARDIOVASCULAR: S1, S2 normal. No murmurs, rubs, or gallops.  ABDOMEN: Soft, nontender, nondistended. Bowel sounds  present. No organomegaly or mass.  EXTREMITIES: No cyanosis, clubbing or edema b/l.  Right Hip dressing in place and no acute bleeding noted.  NEUROLOGIC: Cranial nerves II through XII are intact. No focal Motor or sensory deficits b/l.   PSYCHIATRIC: The patient is alert and oriented x 1.  SKIN: No obvious rash, lesion, or ulcer.    LABORATORY PANEL:   CBC Recent Labs  Lab 10/18/17 0952  WBC 6.2  HGB 9.2*  HCT 26.9*  PLT 216   ------------------------------------------------------------------------------------------------------------------  Chemistries  Recent Labs  Lab 10/18/17 0952  NA 135  K 3.3*  CL 105  CO2 23  GLUCOSE 151*  BUN 10  CREATININE 0.75  CALCIUM 8.2*   ------------------------------------------------------------------------------------------------------------------  Cardiac Enzymes Recent Labs  Lab 10/16/17 2355  TROPONINI <0.03   ------------------------------------------------------------------------------------------------------------------  RADIOLOGY:  Dg Chest 1 View  Result Date: 10/17/2017 CLINICAL DATA:  Fall with hip fracture EXAM: CHEST 1 VIEW COMPARISON:  None FINDINGS: No focal pulmonary opacity or pleural effusion. Normal cardiomediastinal silhouette with aortic atherosclerosis. No pneumothorax. IMPRESSION: No active disease. Electronically Signed   By: Jasmine PangKim  Fujinaga M.D.   On: 10/17/2017 00:52   Ct Head Wo Contrast  Result Date: 10/17/2017 CLINICAL DATA:  Fall EXAM: CT HEAD WITHOUT CONTRAST CT CERVICAL SPINE WITHOUT CONTRAST TECHNIQUE: Multidetector CT imaging of the head and cervical spine was performed following the standard protocol without intravenous contrast. Multiplanar CT image reconstructions of the cervical spine were also generated. COMPARISON:  None. FINDINGS: CT HEAD FINDINGS Brain: No acute territorial infarction, haemorrhage or intracranial mass is visualized. Moderate atrophy. Mild small vessel ischemic changes of the  white matter. Vascular: No  hyperdense vessels.  Carotid vascular calcification Skull: Normal. Negative for fracture or focal lesion. Sinuses/Orbits: Mild mucosal thickening in the sphenoid and ethmoid sinuses. No acute orbital abnormality Other: None CT CERVICAL SPINE FINDINGS Alignment: Reversal of cervical lordosis. Trace anterolisthesis of C3 on C4 and C4 on C5. Facet alignment within normal limits. Skull base and vertebrae: No acute fracture. No primary bone lesion or focal pathologic process. Soft tissues and spinal canal: No prevertebral fluid or swelling. No visible canal hematoma. Disc levels: Moderate degenerative changes at C5-C6 and C6-C7. Mild degenerative changes at C4-C5. Upper chest: Negative. Other: None IMPRESSION: 1. No CT evidence for acute intracranial abnormality. Atrophy and mild small vessel ischemic changes of the white matter 2. Reversal of cervical lordosis with trace anterolisthesis of C3 on C4 and C4 on C5. No definite acute fracture seen. Electronically Signed   By: Jasmine Pang M.D.   On: 10/17/2017 00:42   Ct Cervical Spine Wo Contrast  Result Date: 10/17/2017 CLINICAL DATA:  Fall EXAM: CT HEAD WITHOUT CONTRAST CT CERVICAL SPINE WITHOUT CONTRAST TECHNIQUE: Multidetector CT imaging of the head and cervical spine was performed following the standard protocol without intravenous contrast. Multiplanar CT image reconstructions of the cervical spine were also generated. COMPARISON:  None. FINDINGS: CT HEAD FINDINGS Brain: No acute territorial infarction, haemorrhage or intracranial mass is visualized. Moderate atrophy. Mild small vessel ischemic changes of the white matter. Vascular: No hyperdense vessels.  Carotid vascular calcification Skull: Normal. Negative for fracture or focal lesion. Sinuses/Orbits: Mild mucosal thickening in the sphenoid and ethmoid sinuses. No acute orbital abnormality Other: None CT CERVICAL SPINE FINDINGS Alignment: Reversal of cervical lordosis. Trace  anterolisthesis of C3 on C4 and C4 on C5. Facet alignment within normal limits. Skull base and vertebrae: No acute fracture. No primary bone lesion or focal pathologic process. Soft tissues and spinal canal: No prevertebral fluid or swelling. No visible canal hematoma. Disc levels: Moderate degenerative changes at C5-C6 and C6-C7. Mild degenerative changes at C4-C5. Upper chest: Negative. Other: None IMPRESSION: 1. No CT evidence for acute intracranial abnormality. Atrophy and mild small vessel ischemic changes of the white matter 2. Reversal of cervical lordosis with trace anterolisthesis of C3 on C4 and C4 on C5. No definite acute fracture seen. Electronically Signed   By: Jasmine Pang M.D.   On: 10/17/2017 00:42   Dg Hip Operative Unilat W Or W/o Pelvis Right  Result Date: 10/17/2017 CLINICAL DATA:  Right hip fixation EXAM: OPERATIVE RIGHT HIP (WITH PELVIS IF PERFORMED) 3 VIEWS TECHNIQUE: Fluoroscopic spot image(s) were submitted for interpretation post-operatively. COMPARISON:  10/17/2017 FINDINGS: Internal fixation across the right femoral intertrochanteric fracture. Near anatomic alignment. No hardware complicating feature. IMPRESSION: Internal fixation with near anatomic alignment. Electronically Signed   By: Charlett Nose M.D.   On: 10/17/2017 13:39   Dg Hip Unilat With Pelvis 2-3 Views Right  Result Date: 10/17/2017 CLINICAL DATA:  Hip pain after fall EXAM: DG HIP (WITH OR WITHOUT PELVIS) 2-3V RIGHT COMPARISON:  CT 02/07/2011 FINDINGS: SI joints are non widened. Pubic symphysis and rami are intact. Left femoral head projects in joint. Acute comminuted intertrochanteric fracture with displacement of lesser trochanteric fracture fragment. Cephalad migration of the distal femur. Right femoral head projects in joint. IMPRESSION: Acute comminuted and displaced intertrochanteric fracture of the right hip Electronically Signed   By: Jasmine Pang M.D.   On: 10/17/2017 00:51     ASSESSMENT AND PLAN:    84 year old female with past medical history of hypertension, hyperlipidemia,  macular degeneration, GERD, anxiety, osteoarthritis who presents to the hospital after a mechanical fall and noted to have a right hip fracture.  1.  Status post fall with right hip fracture-patient is status post right hip intramedullary pinning postop day #1. - Seen by physical therapy and recommended short-term rehabilitation. Continuecontrol with as needed Percocet. -Continue Lovenox for DVT prophylaxis.  3.  History of dementia- high risk for sundowning.  Continue Aricept, Remeron, Seroquel. -Avoid benzodiazepines, used Haldol as needed for agitation.  4.  Hyperlipidemia-continue atorvastatin.  5.  Hypothyroidism-continue Synthroid.  6.  Depression-continue Zoloft.  7. HTN - cont. Avapro.     All the records are reviewed and case discussed with Care Management/Social Worker. Management plans discussed with the patient, family and they are in agreement.  CODE STATUS: Full code  DVT Prophylaxis: Lovenox   TOTAL TIME TAKING CARE OF THIS PATIENT: 30 minutes.   POSSIBLE D/C IN 1-2 DAYS, DEPENDING ON CLINICAL CONDITION.   Houston Siren M.D on 10/18/2017 at 1:35 PM  Between 7am to 6pm - Pager - [phone]  After 6pm go to [email or web address] - Social research officer, government  [facility] [place] [facility]  Office  [phone]  CC: Primary care physician; System, Pcp Not In

## 2017-10-18 NOTE — Progress Notes (Signed)
Pt pulled foley catheter out. Peri care done bath given. External cath placed NAD noted. Will cont to monitor

## 2017-10-18 NOTE — Progress Notes (Signed)
Clinical Child psychotherapistocial Worker (CSW) attempted to meet with patient however per nurse she is confused this morning. CSW contacted patient's daughter Joann [name]. Per daughter patient's husband is deceased and patient lives alone. Per Joann [name] she provides support to patient in her home and cooks her dinner every night. Per daughter patient was able to feed, dress and bath herself prior to hospitalization. Daughter reported that patient prepares breakfast and lunch for herself and can walk at baseline. Per daughter patient's confusion is likely due to the pain medication. CSW presented SNF bed offers to daughter and she [facility]. Daughter is okay with a semi-private room. [facility] authorization has been started. [facility] is aware of above.   [employer] Hughes IncorporatedBailey Mordecai Tindol, LCSW [phone]

## 2017-10-18 NOTE — Progress Notes (Signed)
Physical Therapy Treatment Patient Details Name: Joann [name] MRN: [id number] DOB: 08-30-1932 Today's Date: 10/18/2017    History of Present Illness Pt is an84 y.o.femalewho presented with fall and subsequent right hip fracture. Patient was found down on her porch by family. Fall was unwitnessed. Patient has dementia and does not remember how she fell. Patient stated that she does not think she lost consciousness or hit her head.  Pt diagnosed with R comminuted displaced intertrochanteric hip fracture and is s/p IM nail.  PMH includes: dementia, HTN, GERD, and hypothyroidism.  Of note, sister reports pt is legally blind.     PT Comments    Pt presents with deficits in strength, transfers, mobility, gait, balance, and activity tolerance.  Pt very sensitive and guarded with any movements to the RLE and would often cry out during session.  Pt given frequent therapeutic rest breaks and extensive encouragement/reassurance during RLE therex with pt able to tolerate the below gentle exercises.  Pt will benefit from PT services in a SNF setting upon discharge to safely address above deficits for decreased caregiver assistance and eventual return to PLOF.     Follow Up Recommendations  SNF     Equipment Recommendations  None recommended by PT    Recommendations for Other Services       Precautions / Restrictions Precautions Precautions: Fall Precaution Comments: Patient legally blind per sister Restrictions Weight Bearing Restrictions: Yes RLE Weight Bearing: Partial weight bearing    Mobility  Bed Mobility Overal bed mobility: Needs Assistance Bed Mobility: Rolling Rolling: Max assist         General bed mobility comments: Pt remains highly guarded with any movement of the RLE requiring extensive assist for gentle rolling L and R  Transfers                 General transfer comment: Not attempted this session secondary to pt crying out with even gentle movement  of the RLE  Ambulation/Gait             General Gait Details: Unable   Stairs            Wheelchair Mobility    Modified Rankin (Stroke Patients Only)       Balance                                            Cognition Arousal/Alertness: Awake/alert Behavior During Therapy: WFL for tasks assessed/performed Overall Cognitive Status: Impaired/Different from baseline                                 General Comments: Per pt's daughter pt with a h/o dementia but baseline is at a higher level than current level      Exercises Total Joint Exercises Ankle Circles/Pumps: AROM;Both;10 reps;15 reps Quad Sets: Strengthening;AAROM;10 reps;15 reps Gluteal Sets: Strengthening;Both;10 reps;5 reps Short Arc Quad: AROM;AAROM;Both;10 reps;15 reps Heel Slides: AAROM;Both;10 reps(Gentle AAROM on the RLE with low amplitude) Hip ABduction/ADduction: AROM;AAROM;Both;5 reps;10 reps(Gentle AAROM on the RLE with low amplitude) Straight Leg Raises: (Gentle AAROM on the RLE with low amplitude)    General Comments        Pertinent Vitals/Pain Pain Assessment: 0-10 Pain Location: R hip with movement but pt unable to rate secondary to dementia Pain Intervention(s): Premedicated before session;Monitored during session;Limited activity  within patient's tolerance    Home Living                      Prior Function            PT Goals (current goals can now be found in the care plan section)      Frequency    BID      PT Plan Current plan remains appropriate    Co-evaluation              AM-PAC PT "6 Clicks" Daily Activity  Outcome Measure                   End of Session Equipment Utilized During Treatment: Gait belt Activity Tolerance: Patient limited by pain Patient left: in bed;with bed alarm set;with call bell/phone within reach;with family/visitor present Nurse Communication: Mobility status PT Visit  Diagnosis: Other abnormalities of gait and mobility (R26.89);Muscle weakness (generalized) (M62.81)     Time: [id number] PT Time Calculation (min) (ACUTE ONLY): 23 min  Charges:  $Therapeutic Exercise: 8-22 mins $Therapeutic Activity: 8-22 mins                    G Codes:       Elly Modena. Scott Paxton Binns PT, DPT 10/18/17, 4:56 PM

## 2017-10-18 NOTE — Evaluation (Signed)
Physical Therapy Evaluation Patient Details Name: Joann SiasDolores H [name] MRN: [id number] DOB: 10-21-1932 Today's Date: 10/18/2017   History of Present Illness  Pt is an84 y.o.femalewho presented with fall and subsequent right hip fracture. Patient was found down on her porch by family. Fall was unwitnessed. Patient has dementia and does not remember how she fell. Patient stated that she does not think she lost consciousness or hit her head.  Pt diagnosed with R comminuted displaced intertrochanteric hip fracture and is s/p IM nail.  PMH includes: dementia, HTN, GERD, and hypothyroidism.  Of note, sister reports pt is legally blind.     Clinical Impression  Pt presents with deficits in strength, transfers, mobility, gait, balance, and activity tolerance.  Pt required +2 total assist during sup to/from sit and was highly guarded and resistant to RLE movement.  Upon sitting up to EOB pt arched her back posteriorly and would not sit upright causing her buttocks to slide forward toward the EOB creating a high fall risk.  Pt was returned to supine with +2 total assist.  Pt's willingness to allow for RLE movement improved somewhat during gentle RLE therex at the end of the session but she required extensive encouragement and assurance that all movements would be slow and gentle.  Pt will benefit from PT services in a SNF setting upon discharge to safely address above deficits for decreased caregiver assistance and eventual return to PLOF.      Follow Up Recommendations SNF    Equipment Recommendations  None recommended by PT    Recommendations for Other Services       Precautions / Restrictions Precautions Precautions: Fall Precaution Comments: Patient legally blind per sister Restrictions Weight Bearing Restrictions: Yes RLE Weight Bearing: Partial weight bearing      Mobility  Bed Mobility Overal bed mobility: Needs Assistance Bed Mobility: Sit to Supine;Supine to Sit     Supine to  sit: +2 for physical assistance;Total assist Sit to supine: +2 for physical assistance;Total assist   General bed mobility comments: Pt highly guarded with any movement of the RLE requiring total assist for sup to/from sit  Transfers                 General transfer comment: Upon sitting at EOB pt remained with trunk arched posteriorly and would not sit up straight causing her bottom to scoot forward towards EOB putting pt at a fall risk while sitting  Ambulation/Gait             General Gait Details: Unable  Stairs            Wheelchair Mobility    Modified Rankin (Stroke Patients Only)       Balance Overall balance assessment: Needs assistance   Sitting balance-Leahy Scale: Poor   Postural control: Posterior lean     Standing balance comment: Unable to stand                             Pertinent Vitals/Pain Pain Assessment: 0-10 Pain Location: R hip with movement but pt unable to rate secondary to dementia Pain Intervention(s): Monitored during session;Limited activity within patient's tolerance;Premedicated before session    Home Living Family/patient expects to be discharged to:: Private residence(History from sister via phone call as emergency contact) Living Arrangements: Alone Available Help at Discharge: Family;Available PRN/intermittently Type of Home: House Home Access: Stairs to enter Entrance Stairs-Rails: None Entrance Stairs-Number of Steps: 1 step x 2  without rails to enter home Home Layout: Two level;Able to live on main level with bedroom/bathroom Home Equipment: Dan Humphreys - 2 wheels      Prior Function Level of Independence: Needs assistance   Gait / Transfers Assistance Needed: Mod Ind with amb household distances with a RW  ADL's / Homemaking Assistance Needed: Some assist with ADLs from family        Hand Dominance        Extremity/Trunk Assessment        Lower Extremity Assessment Lower Extremity  Assessment: Generalized weakness;RLE deficits/detail RLE Deficits / Details: Pt highly guarded with all attempts at RLE movement  RLE: Unable to fully assess due to pain       Communication      Cognition Arousal/Alertness: Awake/alert Behavior During Therapy: WFL for tasks assessed/performed Overall Cognitive Status: No family/caregiver present to determine baseline cognitive functioning                                 General Comments: Per chart and pt's sister via phone call pt has a h/o dementia but unable to determine how close she is to baseline with no family present      General Comments      Exercises Total Joint Exercises Ankle Circles/Pumps: AROM;Both;5 reps;10 reps(Verbal and tactile cues during all therex for technique and participationi) Quad Sets: AROM;AAROM;Both;5 reps;10 reps Heel Slides: AAROM;Both;10 reps(Gentle on the RLE with low amplitude) Hip ABduction/ADduction: AAROM;Both;10 reps(Gentle on the RLE with low amplitude) Straight Leg Raises: AAROM;Both;10 reps(Gentle on the RLE with low amplitude)   Assessment/Plan    PT Assessment Patient needs continued PT services  PT Problem List Decreased strength;Decreased activity tolerance;Decreased balance;Decreased knowledge of use of DME;Decreased mobility;Decreased safety awareness       PT Treatment Interventions DME instruction;Gait training;Stair training;Functional mobility training;Balance training;Therapeutic exercise;Therapeutic activities;Patient/family education    PT Goals (Current goals can be found in the Care Plan section)  Acute Rehab PT Goals PT Goal Formulation: Patient unable to participate in goal setting Time For Goal Achievement: 10/31/17 Potential to Achieve Goals: Fair    Frequency BID   Barriers to discharge Inaccessible home environment;Decreased caregiver support      Co-evaluation               AM-PAC PT "6 Clicks" Daily Activity  Outcome Measure  Difficulty turning over in bed (including adjusting bedclothes, sheets and blankets)?: Unable Difficulty moving from lying on back to sitting on the side of the bed? : Unable Difficulty sitting down on and standing up from a chair with arms (e.g., wheelchair, bedside commode, etc,.)?: Unable Help needed moving to and from a bed to chair (including a wheelchair)?: Total Help needed walking in hospital room?: Total Help needed climbing 3-5 steps with a railing? : Total 6 Click Score: 6    End of Session Equipment Utilized During Treatment: Gait belt Activity Tolerance: Patient limited by pain Patient left: in bed;with bed alarm set;with call bell/phone within reach Nurse Communication: Mobility status PT Visit Diagnosis: Other abnormalities of gait and mobility (R26.89);Muscle weakness (generalized) (M62.81)    Time: 1610-9604 PT Time Calculation (min) (ACUTE ONLY): 31 min   Charges:   PT Evaluation $PT Eval Low Complexity: 1 Low PT Treatments $Therapeutic Exercise: 8-22 mins   PT G Codes:        DElly Modena PT, DPT 10/18/17, 11:41 AM

## 2017-10-18 NOTE — Anesthesia Postprocedure Evaluation (Signed)
Anesthesia Post Note  Patient: Joann [name]  Procedure(s) Performed: INTRAMEDULLARY (IM) NAIL INTERTROCHANTRIC (Right )  Patient location during evaluation: Nursing Unit Anesthesia Type: Spinal Level of consciousness: oriented and awake and alert Pain management: pain level controlled Vital Signs Assessment: post-procedure vital signs reviewed and stable Respiratory status: spontaneous breathing, respiratory function stable and patient connected to nasal cannula oxygen Cardiovascular status: blood pressure returned to baseline and stable Postop Assessment: no headache, no backache and no apparent nausea or vomiting Anesthetic complications: no     Last Vitals:  Vitals:   10/17/17 2358 10/18/17 0520  BP: (!) 127/39 (!) 143/51  Pulse: 80 77  Resp: 19 19  Temp: 36.6 C 36.6 C  SpO2: 97% 97%    Last Pain:  Vitals:   10/18/17 0520  TempSrc: Oral  PainSc:                  Rica MastBachich,  Gerrod Maule M

## 2017-10-19 ENCOUNTER — Encounter
Admission: RE | Admit: 2017-10-19 | Discharge: 2017-10-19 | Disposition: A | Discharge status: Home / Self Care | Payer: PPO | Source: Ambulatory Visit | Attending: Internal Medicine | Admitting: Internal Medicine

## 2017-10-19 DIAGNOSIS — Z23 Encounter for immunization: Secondary | ICD-10-CM | POA: Diagnosis not present

## 2017-10-19 DIAGNOSIS — X58XXXD Exposure to other specified factors, subsequent encounter: Secondary | ICD-10-CM | POA: Diagnosis not present

## 2017-10-19 DIAGNOSIS — R55 Syncope and collapse: Secondary | ICD-10-CM | POA: Diagnosis not present

## 2017-10-19 DIAGNOSIS — M810 Age-related osteoporosis without current pathological fracture: Secondary | ICD-10-CM | POA: Diagnosis not present

## 2017-10-19 DIAGNOSIS — R262 Difficulty in walking, not elsewhere classified: Secondary | ICD-10-CM | POA: Diagnosis not present

## 2017-10-19 DIAGNOSIS — R441 Visual hallucinations: Secondary | ICD-10-CM | POA: Diagnosis not present

## 2017-10-19 DIAGNOSIS — R197 Diarrhea, unspecified: Secondary | ICD-10-CM | POA: Insufficient documentation

## 2017-10-19 DIAGNOSIS — E785 Hyperlipidemia, unspecified: Secondary | ICD-10-CM | POA: Diagnosis not present

## 2017-10-19 DIAGNOSIS — Z9181 History of falling: Secondary | ICD-10-CM | POA: Diagnosis not present

## 2017-10-19 DIAGNOSIS — E039 Hypothyroidism, unspecified: Secondary | ICD-10-CM | POA: Diagnosis not present

## 2017-10-19 DIAGNOSIS — A0472 Enterocolitis due to Clostridium difficile, not specified as recurrent: Secondary | ICD-10-CM | POA: Diagnosis not present

## 2017-10-19 DIAGNOSIS — R1312 Dysphagia, oropharyngeal phase: Secondary | ICD-10-CM | POA: Diagnosis not present

## 2017-10-19 DIAGNOSIS — Z7401 Bed confinement status: Secondary | ICD-10-CM | POA: Diagnosis not present

## 2017-10-19 DIAGNOSIS — R4182 Altered mental status, unspecified: Secondary | ICD-10-CM | POA: Diagnosis not present

## 2017-10-19 DIAGNOSIS — L03818 Cellulitis of other sites: Secondary | ICD-10-CM | POA: Diagnosis not present

## 2017-10-19 DIAGNOSIS — S72001A Fracture of unspecified part of neck of right femur, initial encounter for closed fracture: Secondary | ICD-10-CM | POA: Diagnosis not present

## 2017-10-19 DIAGNOSIS — M8000XD Age-related osteoporosis with current pathological fracture, unspecified site, subsequent encounter for fracture with routine healing: Secondary | ICD-10-CM | POA: Diagnosis not present

## 2017-10-19 DIAGNOSIS — S72141D Displaced intertrochanteric fracture of right femur, subsequent encounter for closed fracture with routine healing: Secondary | ICD-10-CM | POA: Diagnosis not present

## 2017-10-19 DIAGNOSIS — M25551 Pain in right hip: Secondary | ICD-10-CM | POA: Diagnosis not present

## 2017-10-19 DIAGNOSIS — E538 Deficiency of other specified B group vitamins: Secondary | ICD-10-CM | POA: Diagnosis not present

## 2017-10-19 DIAGNOSIS — F0391 Unspecified dementia with behavioral disturbance: Secondary | ICD-10-CM | POA: Diagnosis not present

## 2017-10-19 DIAGNOSIS — F0281 Dementia in other diseases classified elsewhere with behavioral disturbance: Secondary | ICD-10-CM | POA: Diagnosis not present

## 2017-10-19 DIAGNOSIS — Z87891 Personal history of nicotine dependence: Secondary | ICD-10-CM | POA: Diagnosis not present

## 2017-10-19 DIAGNOSIS — M6281 Muscle weakness (generalized): Secondary | ICD-10-CM | POA: Diagnosis not present

## 2017-10-19 DIAGNOSIS — I10 Essential (primary) hypertension: Secondary | ICD-10-CM | POA: Diagnosis not present

## 2017-10-19 DIAGNOSIS — E46 Unspecified protein-calorie malnutrition: Secondary | ICD-10-CM | POA: Diagnosis not present

## 2017-10-19 DIAGNOSIS — Z9889 Other specified postprocedural states: Secondary | ICD-10-CM | POA: Diagnosis not present

## 2017-10-19 DIAGNOSIS — W1830XA Fall on same level, unspecified, initial encounter: Secondary | ICD-10-CM | POA: Diagnosis not present

## 2017-10-19 DIAGNOSIS — K219 Gastro-esophageal reflux disease without esophagitis: Secondary | ICD-10-CM | POA: Diagnosis not present

## 2017-10-19 DIAGNOSIS — E782 Mixed hyperlipidemia: Secondary | ICD-10-CM | POA: Diagnosis not present

## 2017-10-19 DIAGNOSIS — H3530 Unspecified macular degeneration: Secondary | ICD-10-CM | POA: Diagnosis not present

## 2017-10-19 DIAGNOSIS — S72001D Fracture of unspecified part of neck of right femur, subsequent encounter for closed fracture with routine healing: Secondary | ICD-10-CM | POA: Diagnosis not present

## 2017-10-19 DIAGNOSIS — F329 Major depressive disorder, single episode, unspecified: Secondary | ICD-10-CM | POA: Diagnosis not present

## 2017-10-19 LAB — BPAM RBC
BLOOD PRODUCT EXPIRATION DATE: 201904042359
Blood Product Expiration Date: 201904042359
Unit Type and Rh: 5100
Unit Type and Rh: 5100

## 2017-10-19 LAB — CBC
HCT: 27.4 % — ABNORMAL LOW (ref 35.0–47.0)
Hemoglobin: 9.3 g/dL — ABNORMAL LOW (ref 12.0–16.0)
MCH: 30.5 pg (ref 26.0–34.0)
MCHC: 33.9 g/dL (ref 32.0–36.0)
MCV: 90 fL (ref 80.0–100.0)
Platelets: 205 10*3/uL (ref 150–440)
RBC: 3.05 MIL/uL — ABNORMAL LOW (ref 3.80–5.20)
RDW: 14.6 % — ABNORMAL HIGH (ref 11.5–14.5)
WBC: 6.7 10*3/uL (ref 3.6–11.0)

## 2017-10-19 LAB — TYPE AND SCREEN
ABO/RH(D): O POS
ANTIBODY IDENTIFICATION: NOT DETECTED
Antibody Screen: POSITIVE
UNIT DIVISION: 0
Unit division: 0

## 2017-10-19 LAB — BASIC METABOLIC PANEL
Anion gap: 7 (ref 5–15)
BUN: 10 mg/dL (ref 6–20)
CHLORIDE: 107 mmol/L (ref 101–111)
CO2: 24 mmol/L (ref 22–32)
CREATININE: 0.7 mg/dL (ref 0.44–1.00)
Calcium: 8.5 mg/dL — ABNORMAL LOW (ref 8.9–10.3)
GFR calc Af Amer: >60 mL/min (ref >60)
GFR calc non Af Amer: >60 mL/min (ref >60)
Glucose, Bld: 98 mg/dL (ref 65–99)
Potassium: 3.3 mmol/L — ABNORMAL LOW (ref 3.5–5.1)
SODIUM: 138 mmol/L (ref 135–145)

## 2017-10-19 MED ORDER — OXYCODONE-ACETAMINOPHEN 5-325 MG PO TABS: 1.0000 | ORAL_TABLET | ORAL | 0 refills | Status: DC | PRN

## 2017-10-19 MED ORDER — ENOXAPARIN SODIUM 40 MG/0.4ML ~~LOC~~ SOLN: 40.0000 mg | SUBCUTANEOUS | Status: DC

## 2017-10-19 NOTE — Progress Notes (Signed)
Patient is medically stable for [facility] today to room [facility]B. RN will call report at [phone] and arrange EMS for transport. [facility] authorization has been received. Clinical Child psychotherapistocial Worker (CSW) sent [facility] orders [facility] via [employer]. CSW contacted patient's daughter [name] and made her aware of above. Please reconsult if future social work needs arise. CSW signing off.   [employer]Bailey Colleene Swarthout, LCSW [phone]

## 2017-10-19 NOTE — Progress Notes (Signed)
[facility]. [facility].  EMS called for transportation

## 2017-10-19 NOTE — Progress Notes (Signed)
Physical Therapy Treatment Patient Details Name: Joann [name] MRN: [id number] DOB: 02-05-1933 Today's Date: 10/19/2017    History of Present Illness Pt is an84 y.o.femalewho presented with fall and subsequent right hip fracture. Patient was found down on her porch by family. Fall was unwitnessed. Patient has dementia and does not remember how she fell. Patient stated that she does not think she lost consciousness or hit her head.  Pt diagnosed with R comminuted displaced intertrochanteric hip fracture and is s/p IM nail.  PMH includes: dementia, HTN, GERD, and hypothyroidism.  Of note, sister reports pt is legally blind.     PT Comments    Pt presents with deficits in strength, transfers, mobility, gait, balance, and activity tolerance but made some progress this session.  Pt required +2 max A for bed mobility tasks along with extra time secondary to R hip pain during movement and also extensive cues for sequencing.  Pt again presented with posterior lean during initial sitting at EOB but this session her sitting balance improved with anterior weight shifting cues/activities until she eventually required only close SBA while sitting.  Pt able to stand from elevated EOB this session but required extensive assist to stand and continued extensive assist to prevent posterior LOB while in standing.  Pt unable to advance either LE during session and was only able to stand a max of 15-20 sec before requiring to return to sitting.  Pt will benefit from PT services in a SNF setting upon discharge to safely address above deficits for decreased caregiver assistance and eventual return to PLOF.     Follow Up Recommendations  SNF     Equipment Recommendations  None recommended by PT    Recommendations for Other Services       Precautions / Restrictions Precautions Precautions: Fall Precaution Comments: Patient legally blind per sister Restrictions Weight Bearing Restrictions: Yes RLE  Weight Bearing: Partial weight bearing    Mobility  Bed Mobility Overal bed mobility: Needs Assistance Bed Mobility: Rolling;Supine to Sit;Sit to Supine Rolling: Max assist   Supine to sit: Max assist;+2 for physical assistance Sit to supine: +2 for physical assistance;Max assist   General bed mobility comments: Pt remains highly guarded with any movement of the RLE requiring extensive assist and cues for sequencing  Transfers Overall transfer level: Needs assistance Equipment used: Rolling walker (2 wheeled) Transfers: Sit to/from Stand Sit to Stand: +2 physical assistance;Mod assist         General transfer comment: Pt required constant mod A once in standing to prevent posterior LOB.  Ambulation/Gait             General Gait Details: Unable   Stairs            Wheelchair Mobility    Modified Rankin (Stroke Patients Only)       Balance Overall balance assessment: Needs assistance Sitting-balance support: Feet unsupported;Feet supported;Bilateral upper extremity supported Sitting balance-Leahy Scale: Fair Sitting balance - Comments: Poor sitting balance initially with posterior lean but progressed to fair stability during the session with pt able to maintain sitting balance with close SBA   Standing balance support: Bilateral upper extremity supported Standing balance-Leahy Scale: Poor                              Cognition Arousal/Alertness: Awake/alert Behavior During Therapy: WFL for tasks assessed/performed Overall Cognitive Status: Within Functional Limits for tasks assessed  Exercises Total Joint Exercises Ankle Circles/Pumps: AROM;Both;10 reps Quad Sets: Strengthening;AAROM;10 reps Short Arc Quad: AROM;AAROM;Both;10 reps Heel Slides: AAROM;Both;10 reps;5 reps Hip ABduction/ADduction: AROM;AAROM;Both;5 reps;10 reps Straight Leg Raises: AROM;AAROM;Both;10 reps;5 reps Long  Arc Quad: AROM;Both;10 reps Knee Flexion: AROM;Both;10 reps    General Comments        Pertinent Vitals/Pain Pain Location: R hip with movement but pt unable to rate secondary to dementia Pain Intervention(s): Premedicated before session;Monitored during session;Limited activity within patient's tolerance    Home Living                      Prior Function            PT Goals (current goals can now be found in the care plan section) Progress towards PT goals: Progressing toward goals    Frequency    BID      PT Plan Current plan remains appropriate    Co-evaluation              AM-PAC PT "6 Clicks" Daily Activity  Outcome Measure                   End of Session Equipment Utilized During Treatment: Gait belt Activity Tolerance: Patient limited by pain Patient left: in bed;with call bell/phone within reach;with family/visitor present;with nursing/sitter in room(Nursing entered room aware bed alarm off to perform hygiene at end of session) Nurse Communication: Mobility status PT Visit Diagnosis: Other abnormalities of gait and mobility (R26.89);Muscle weakness (generalized) (M62.81)     Time: [id number] PT Time Calculation (min) (ACUTE ONLY): 24 min  Charges:  $Therapeutic Exercise: 8-22 mins $Therapeutic Activity: 8-22 mins                    G Codes:       D. Elly ModenaScott Marissa Weaver PT, DPT 10/19/17, 11:07 AM

## 2017-10-19 NOTE — Clinical Social Work Placement (Signed)
   CLINICAL SOCIAL WORK PLACEMENT  NOTE  Date:  10/19/2017  Patient Details  Name: Joann SiasDolores H Pasquariello MRN: [id number] Date of Birth: 08/27/1932  Clinical Social Work is seeking post-discharge placement for this patient at the Skilled  Nursing Facility level of care (*CSW will initial, date and re-position this form in  chart as items are completed):  Yes   Patient/family provided with [facility] Clinical Social Work Department's list of facilities offering this level of care within the geographic area requested by the patient (or if unable, by the patient's family).  Yes   Patient/family informed of their freedom to choose among providers that offer the needed level of care, that participate in Medicare, Medicaid or managed care program needed by the patient, have an available bed and are willing to accept the patient.  Yes   Patient/family informed of [facility]'s ownership interest in [facility] and [facility], as well as of the fact that they are under no obligation to receive care at these facilities.  PASRR submitted to EDS on 10/17/17     PASRR number received on 10/17/17     Existing PASRR number confirmed on       FL2 transmitted to all facilities in geographic area requested by pt/family on 10/17/17     FL2 transmitted to all facilities within larger geographic area on       Patient informed that his/her managed care company has contracts with or will negotiate with certain facilities, including the following:        Yes   Patient/family informed of bed offers received.  Patient chooses bed at [facility] )     Physician recommends and patient chooses bed at      Patient to be transferred to [facility] ) on 10/19/17.  Patient to be transferred to facility by [facility] )     Patient family notified on 10/19/17 of transfer.  Name of family member notified:  (Patient's daughter [name] is aware of D/C today. )     PHYSICIAN        Additional Comment:    _______________________________________________ Niajah Sipos, Darleen CrockerBailey M, LCSW 10/19/2017, 12:01 PM

## 2017-10-19 NOTE — Clinical Social Work Note (Signed)
Patient to be d/c'ed today to [facility] room 208B.  Patient and family agreeable to plans will transport via ems RN to call report, [phone].  Windell Moulding, MSW, Theresia Majors [phone]

## 2017-10-19 NOTE — Discharge Summary (Addendum)
[facility] [place] [facility]   PATIENT NAME: Joann DacostaDolores [name]    MR#:  [id number]  DATE OF BIRTH:  06/01/1933  DATE OF ADMISSION:  10/16/2017 ADMITTING PHYSICIAN: Oralia Manisavid Willis, MD  DATE OF DISCHARGE: 10/19/2017  PRIMARY CARE PHYSICIAN: System, Pcp Not In    ADMISSION DIAGNOSIS:  Fall [W19.XXXA] Fall, initial encounter [id number][W19.XXXA] Closed fracture of right hip, initial encounter (HCC) [S72.001A]  DISCHARGE DIAGNOSIS:  Principal Problem:   Closed right hip fracture (HCC) Active Problems:   Hypertension   HLD (hyperlipidemia)   Dementia with behavioral disturbance   GERD (gastroesophageal reflux disease)   Hypothyroidism   SECONDARY DIAGNOSIS:   Past Medical History:  Diagnosis Date  . Anxiety   . Arthritis   . Chicken pox   . Colon polyp   . GERD (gastroesophageal reflux disease)   . Hyperlipidemia   . Hypertension   . Macular degeneration    Dr. [place]ne [place]ers in [place]  . Thyroid disease     HOSPITAL COURSE:   84 year old female with past medical history of hypertension, hyperlipidemia, macular degeneration, GERD, anxiety, osteoarthritis who presents to the hospital after a mechanical fall and noted to have a right hip fracture.  1.  Status post fall with right hip fracture-secondary to mechanical fall. patient is status post right hip intramedullary pinning postop day #2. -Patient's pain is well-controlled on some oral Percocet. She is tolerating physical therapy and is now being discharged to short-term rehabilitation for further care. -She will continue Lovenox for DVT prophylaxis.  3.  History of dementia-she will Continue Aricept, Remeron, Seroquel. -Avoid benzodiazepines, used Haldol as needed for agitation.  4.  Hyperlipidemia- she will continue atorvastatin.  5.  Hypothyroidism- she will continue Synthroid.  6.  Depression-she will continue Zoloft.   DISCHARGE CONDITIONS:   Stable.   CONSULTS OBTAINED:  Treatment Team:   Deeann SaintMiller, Howard, MD  DRUG ALLERGIES:   Allergies  Allergen Reactions  . Codeine Other (See Comments)    Paralysis     DISCHARGE MEDICATIONS:   Allergies as of 10/19/2017      Reactions   Codeine Other (See Comments)   Paralysis       Medication List    TAKE these medications   atorvastatin 10 MG tablet Commonly known as:  LIPITOR Take 1 tablet (10 mg total) by mouth daily.   donepezil 10 MG tablet Commonly known as:  ARICEPT Take 10 mg by mouth at bedtime.   enoxaparin 40 MG/0.4ML injection Commonly known as:  LOVENOX Inject 0.4 mLs (40 mg total) into the skin daily for 14 days. Start taking on:  10/20/2017   folic acid 1 MG tablet Commonly known as:  FOLVITE Take 1 mg by mouth daily.   levothyroxine 25 MCG tablet Commonly known as:  SYNTHROID, LEVOTHROID TAKE ONE TABLET BY MOUTH ONCE DAILY   mirtazapine 7.5 MG tablet Commonly known as:  REMERON Take 7.5 mg by mouth at bedtime.   oxyCODONE-acetaminophen 5-325 MG tablet Commonly known as:  ROXICET Take 1 tablet by mouth every 4 (four) hours as needed for severe pain.   QUEtiapine 50 MG tablet Commonly known as:  SEROQUEL Take 100 mg by mouth at bedtime.   sertraline 50 MG tablet Commonly known as:  ZOLOFT TAKE ONE TABLET BY MOUTH ONCE DAILY *FOLLOW  UP  WITH  PCP  FOR  MORE  REFILLS*   zinc gluconate 50 MG tablet Take 50 mg by mouth daily.  DISCHARGE INSTRUCTIONS:   DIET:  Regular diet  DISCHARGE CONDITION:  Stable  ACTIVITY:  Activity as tolerated  OXYGEN:  Home Oxygen: No.   Oxygen Delivery: room air  DISCHARGE LOCATION:  nursing home   If you experience worsening of your admission symptoms, develop shortness of breath, life threatening emergency, suicidal or homicidal thoughts you must seek medical attention immediately by calling 911 or calling your MD immediately  if symptoms less severe.  You Must read complete instructions/literature along with all the possible adverse  reactions/side effects for all the Medicines you take and that have been prescribed to you. Take any new Medicines after you have completely understood and accpet all the possible adverse reactions/side effects.   Please note  You were cared for by a hospitalist during your hospital stay. If you have any questions about your discharge medications or the care you received while you were in the hospital after you are discharged, you can call the unit and asked to speak with the hospitalist on call if the hospitalist that took care of you is not available. Once you are discharged, your primary care physician will handle any further medical issues. Please note that NO REFILLS for any discharge medications will be authorized once you are discharged, as it is imperative that you return to your primary care physician (or establish a relationship with a primary care physician if you do not have one) for your aftercare needs so that they can reassess your need for medications and monitor your lab values.     Today   Still complaining of some right hip pain. No other acute events overnight. Tolerating physical therapy well. We'll discharge to short-term rehabilitation today.  VITAL SIGNS:  Blood pressure (!) 114/50, pulse 71, temperature 98.6 F (37 C), temperature source Oral, resp. rate 20, height 5\' 1"  (1.549 m), weight 63.5 kg (140 lb), SpO2 92 %.  I/O:    Intake/Output Summary (Last 24 hours) at 10/19/2017 1230 Last data filed at 10/19/2017 0500 Gross per 24 hour  Intake 600 ml  Output 200 ml  Net 400 ml    PHYSICAL EXAMINATION:   GENERAL:  84 y.o.-year-old patient lying in bed in no acute distress.  EYES: Pupils equal, round, reactive to light and accommodation. No scleral icterus. Extraocular muscles intact.  HEENT: Head atraumatic, normocephalic. Oropharynx and nasopharynx clear.  NECK:  Supple, no jugular venous distention. No thyroid enlargement, no tenderness.  LUNGS: Normal breath  sounds bilaterally, no wheezing, rales, rhonchi. No use of accessory muscles of respiration.  CARDIOVASCULAR: S1, S2 normal. No murmurs, rubs, or gallops.  ABDOMEN: Soft, nontender, nondistended. Bowel sounds present. No organomegaly or mass.  EXTREMITIES: No cyanosis, clubbing or edema b/l.  Right Hip dressing in place and no acute bleeding noted.  NEUROLOGIC: Cranial nerves II through XII are intact. No focal Motor or sensory deficits b/l.   PSYCHIATRIC: The patient is alert and oriented x 1.  SKIN: No obvious rash, lesion, or ulcer    DATA REVIEW:   CBC Recent Labs  Lab 10/19/17 0420  WBC 6.7  HGB 9.3*  HCT 27.4*  PLT 205    Chemistries  Recent Labs  Lab 10/19/17 0420  NA 138  K 3.3*  CL 107  CO2 24  GLUCOSE 98  BUN 10  CREATININE 0.70  CALCIUM 8.5*    Cardiac Enzymes Recent Labs  Lab 10/16/17 2355  TROPONINI <0.03    Microbiology Results  Results for orders placed or performed during the  hospital encounter of 10/16/17  Surgical PCR screen     Status: Abnormal   Collection Time: 10/17/17  5:00 AM  Result Value Ref Range Status   MRSA, PCR NEGATIVE NEGATIVE Final   Staphylococcus aureus POSITIVE (A) NEGATIVE Final    Comment: (NOTE) The Xpert SA Assay (FDA approved for NASAL specimens in patients 71 years of age and older), is one component of a comprehensive surveillance program. It is not intended to diagnose infection nor to guide or monitor treatment. Performed at [facility], [address] [place], [place] [address]     RADIOLOGY:  Dg Hip Operative Unilat W Or W/o Pelvis Right  Result Date: 10/17/2017 CLINICAL DATA:  Right hip fixation EXAM: OPERATIVE RIGHT HIP (WITH PELVIS IF PERFORMED) 3 VIEWS TECHNIQUE: Fluoroscopic spot image(s) were submitted for interpretation post-operatively. COMPARISON:  10/17/2017 FINDINGS: Internal fixation across the right femoral intertrochanteric fracture. Near anatomic alignment. No hardware  complicating feature. IMPRESSION: Internal fixation with near anatomic alignment. Electronically Signed   By: Charlett Nose M.D.   On: 10/17/2017 13:39      Management plans discussed with the patient, family and they are in agreement.  CODE STATUS:     Code Status Orders  (From admission, onward)        Start     Ordered   10/17/17 0436  Full code  Continuous     10/17/17 0435    TOTAL TIME TAKING CARE OF THIS PATIENT: 40 minutes.    Houston Siren M.D on 10/19/2017 at 12:30 PM  Between 7am to 6pm - Pager - [phone]  After 6pm go to [email or web address] - Social research officer, government  [facility]  Office  [phone]  CC: Primary care physician; System, Pcp Not In

## 2017-10-20 DIAGNOSIS — M8000XD Age-related osteoporosis with current pathological fracture, unspecified site, subsequent encounter for fracture with routine healing: Secondary | ICD-10-CM | POA: Diagnosis not present

## 2017-10-20 DIAGNOSIS — R441 Visual hallucinations: Secondary | ICD-10-CM | POA: Diagnosis not present

## 2017-10-20 DIAGNOSIS — F0391 Unspecified dementia with behavioral disturbance: Secondary | ICD-10-CM | POA: Diagnosis not present

## 2017-10-21 ENCOUNTER — Other Ambulatory Visit
Admission: RE | Admit: 2017-10-21 | Discharge: 2017-10-21 | Disposition: A | Discharge status: Home / Self Care | Payer: PPO | Source: Ambulatory Visit | Attending: Gerontology | Admitting: Gerontology

## 2017-10-21 DIAGNOSIS — X58XXXD Exposure to other specified factors, subsequent encounter: Secondary | ICD-10-CM | POA: Insufficient documentation

## 2017-10-21 DIAGNOSIS — S72141D Displaced intertrochanteric fracture of right femur, subsequent encounter for closed fracture with routine healing: Secondary | ICD-10-CM | POA: Insufficient documentation

## 2017-10-21 LAB — CBC WITH DIFFERENTIAL/PLATELET
BASOS PCT: 1 %
Basophils Absolute: 0.1 10*3/uL (ref 0–0.1)
EOS ABS: 0.3 10*3/uL (ref 0–0.7)
Eosinophils Relative: 4 %
HCT: 28.4 % — ABNORMAL LOW (ref 35.0–47.0)
HEMOGLOBIN: 9.4 g/dL — AB (ref 12.0–16.0)
Lymphocytes Relative: 21 %
Lymphs Abs: 1.4 10*3/uL (ref 1.0–3.6)
MCH: 29.9 pg (ref 26.0–34.0)
MCHC: 33 g/dL (ref 32.0–36.0)
MCV: 90.7 fL (ref 80.0–100.0)
MONOS PCT: 8 %
Monocytes Absolute: 0.5 10*3/uL (ref 0.2–0.9)
NEUTROS PCT: 66 %
Neutro Abs: 4.3 10*3/uL (ref 1.4–6.5)
PLATELETS: 273 10*3/uL (ref 150–440)
RBC: 3.13 MIL/uL — AB (ref 3.80–5.20)
RDW: 14.6 % — ABNORMAL HIGH (ref 11.5–14.5)
WBC: 6.5 10*3/uL (ref 3.6–11.0)

## 2017-10-21 LAB — COMPREHENSIVE METABOLIC PANEL
ALK PHOS: 62 U/L (ref 38–126)
ALT: 10 U/L — ABNORMAL LOW (ref 14–54)
ANION GAP: 7 (ref 5–15)
AST: 21 U/L (ref 15–41)
Albumin: 2.5 g/dL — ABNORMAL LOW (ref 3.5–5.0)
BUN: 11 mg/dL (ref 6–20)
CALCIUM: 8.8 mg/dL — AB (ref 8.9–10.3)
CHLORIDE: 108 mmol/L (ref 101–111)
CO2: 27 mmol/L (ref 22–32)
Creatinine, Ser: 0.76 mg/dL (ref 0.44–1.00)
GFR calc Af Amer: >60 mL/min (ref >60)
GFR calc non Af Amer: >60 mL/min (ref >60)
GLUCOSE: 104 mg/dL — AB (ref 65–99)
Potassium: 3.5 mmol/L (ref 3.5–5.1)
SODIUM: 142 mmol/L (ref 135–145)
Total Bilirubin: 0.9 mg/dL (ref 0.3–1.2)
Total Protein: 5.7 g/dL — ABNORMAL LOW (ref 6.5–8.1)

## 2017-10-22 ENCOUNTER — Other Ambulatory Visit
Admission: RE | Admit: 2017-10-22 | Discharge: 2017-10-22 | Disposition: A | Discharge status: Home / Self Care | Payer: PPO | Source: Skilled Nursing Facility | Attending: Gerontology | Admitting: Gerontology

## 2017-10-22 DIAGNOSIS — R55 Syncope and collapse: Secondary | ICD-10-CM | POA: Insufficient documentation

## 2017-10-23 DIAGNOSIS — R197 Diarrhea, unspecified: Secondary | ICD-10-CM | POA: Diagnosis not present

## 2017-10-23 LAB — URINALYSIS, COMPLETE (UACMP) WITH MICROSCOPIC
Bilirubin Urine: NEGATIVE
GLUCOSE, UA: NEGATIVE mg/dL
Hgb urine dipstick: NEGATIVE
Ketones, ur: NEGATIVE mg/dL
Nitrite: NEGATIVE
PROTEIN: NEGATIVE mg/dL
Specific Gravity, Urine: 1.023 (ref 1.005–1.030)
pH: 6 (ref 5.0–8.0)

## 2017-10-23 LAB — C DIFFICILE QUICK SCREEN W PCR REFLEX
C DIFFICILE (CDIFF) INTERP: DETECTED
C DIFFICILE (CDIFF) TOXIN: POSITIVE — AB
C DIFFICLE (CDIFF) ANTIGEN: POSITIVE — AB

## 2017-10-25 ENCOUNTER — Other Ambulatory Visit: Payer: Self-pay

## 2017-10-25 LAB — URINE CULTURE

## 2017-10-25 NOTE — Patient Outreach (Signed)
[name] Care Management  10/25/2017  Joann SiasDolores H [name] 10-Sep-1932 [id number]   EMMI- General Discharge  RED ON EMMI ALERT Day # 1 Date: 10/22/17 Red Alert Reason: Scheduled Follow up? No   Telephone call to patient to address EMMI alert. Fast busy signal noted.     Plan: RN CM will send letter to attempt outreach and attempt patient again within 4 business days.    Bary Lericheionne J Angelli Baruch, RN, MSN [facility] Care Management Care Management Coordinator Direct Line [phone] Toll Free: [phone]  Fax: [phone]

## 2017-10-26 ENCOUNTER — Ambulatory Visit: Payer: Self-pay

## 2017-10-26 ENCOUNTER — Other Ambulatory Visit: Payer: Self-pay

## 2017-10-26 NOTE — Patient Outreach (Addendum)
[facility] Care Management  10/26/2017  Joann SiasDolores H [name] 12-26-1932 [id number]   EMMI- General Discharge     RED ON EMMI ALERT Day # 1 Date:10/22/17 Red Alert Reason: Scheduled Follow up? No   Telephone call to patient to address EMMI red alert.  No answer.  HIPAA compliant voice message left.  [facility]. Verified that patient is a resident there.       Update: Returned call to daughter Rosanne GuttingLisa Sutton.  She is able to verify HIPAA.  She states patient is currently at [facility].  She states that she answered that automated call.  Asked her about patient follow up appointment. She states patient to see Dr. Rondel BatonMiller's group on 11-02-17 for follow up.  She states that she has an appointment with the facility for planning as the goal is to take patient home but presently patient has C. Diff and is on antibiotics and probiotics.   She states she has never attempted this but states that she works full time and welcomes any resources to help.  Advised her that being that patient is in the nursing home our social worker would be involved with helping. Sh e verbalized understanding.  Also advised her that CM would contact social work of our conversation and to reach out to her.  She verbalized understanding.    Plan: RN CM will sign off case at this time as social work is involved due to patient being in rehab.  Bary Lericheionne J Monie Shere, RN, MSN [facility] Care Management Care Management Coordinator Direct Line [phone] Toll Free: [phone]  Fax: [phone]

## 2017-10-29 ENCOUNTER — Non-Acute Institutional Stay (SKILLED_NURSING_FACILITY): Payer: PPO | Admitting: Gerontology

## 2017-10-29 ENCOUNTER — Encounter: Payer: Self-pay | Admitting: Gerontology

## 2017-10-29 DIAGNOSIS — L03818 Cellulitis of other sites: Secondary | ICD-10-CM

## 2017-10-29 DIAGNOSIS — E46 Unspecified protein-calorie malnutrition: Secondary | ICD-10-CM

## 2017-10-29 DIAGNOSIS — F0391 Unspecified dementia with behavioral disturbance: Secondary | ICD-10-CM

## 2017-10-29 DIAGNOSIS — S72001D Fracture of unspecified part of neck of right femur, subsequent encounter for closed fracture with routine healing: Secondary | ICD-10-CM | POA: Diagnosis not present

## 2017-10-29 NOTE — Progress Notes (Signed)
Location:   [facility] Room Number: 208B Place of Service:  SNF [phone]) Provider:  Lorenso Quarry, NP-C  System, Pcp Not In  Patient Care Team: System, Pcp Not In as PCP - [employer], Clent Jacks, LCSW as [facility] [employer] Care Management  Extended Emergency Contact Information Primary Emergency Contact: Evelena Peat [place] Home Phone: [phone] Relation: None Secondary Emergency Contact: Rosanne Gutting Mobile Phone: [phone] Relation: Daughter  Code Status:  DNR Goals of care: Advanced Directive information Advanced Directives 10/29/2017  Does Patient Have a Medical Advance Directive? Yes  Type of Estate agent of [place];Living will  Does patient want to make changes to medical advance directive? No - Patient declined  Copy of Healthcare Power of Attorney in Chart? Yes     Chief Complaint  Patient presents with  . Acute Visit    Recheck incision irritated    HPI:  Pt is a 84 y.o. female seen today for an acute visit for assessment of irritated incision. Pt is s/p Intratrochanteric Fracture of the right hip with Intramedullary Nailing. Staples are intact to both incisions. Peri-incisional tissue is red and mildly warm to touch. Evidence of dried serous drainage. No dressing was in place to cover the incision. Pt is confused. Likely has been touching the wound. Pt is on Cipro for UTI. Pt is unable to fully participate in therapy d/t her confusion. Pt is being followed by Palliative Care and will likely transition to Hospice services upon discharge. Pt denies pain. Pt has been afebrile. She is pleasantly confused. VSS. No other complaints.   Please note pt with limited verbal ability. Unable to obtain complete ROS. Some ROS info obtained from staff and documentation.    Past Medical History:  Diagnosis Date  . Anxiety   . Anxiety disorder   . Arthritis   . Chicken pox   . Colon polyp   .  GERD (gastroesophageal reflux disease)   . Hyperlipidemia   . Hypertension   . Macular degeneration    Dr. Allyne Gee in [place]  . Thyroid disease    Past Surgical History:  Procedure Laterality Date  . APPENDECTOMY  1942  . INTRAMEDULLARY (IM) NAIL INTERTROCHANTERIC Right 10/17/2017   Procedure: INTRAMEDULLARY (IM) NAIL INTERTROCHANTRIC;  Surgeon: Deeann Saint, MD;  Location: [facility] ORS;  Service: Orthopedics;  Laterality: Right;  . KNEE DISLOCATION SURGERY Right 1996  . TOTAL ABDOMINAL HYSTERECTOMY W/ BILATERAL SALPINGOOPHORECTOMY  1971    Allergies  Allergen Reactions  . Codeine Other (See Comments)    Paralysis     Allergies as of 10/29/2017      Reactions   Codeine Other (See Comments)   Paralysis       Medication List        Accurate as of 10/29/17 11:13 AM. Always use your most recent med list.          acetaminophen 325 MG tablet Commonly known as:  TYLENOL Take 650 mg by mouth 4 (four) times daily.   atorvastatin 10 MG tablet Commonly known as:  LIPITOR Take 1 tablet (10 mg total) by mouth daily.   Cholecalciferol 4000 units Caps Take 1 capsule by mouth daily.   donepezil 10 MG tablet Commonly known as:  ARICEPT Take 10 mg by mouth at bedtime.   enoxaparin 40 MG/0.4ML injection Commonly known as:  LOVENOX Inject 0.4 mLs (40 mg total) into the skin daily for 14 days.   ENSURE ENLIVE PO Take 1  Bottle by mouth 2 (two) times daily between meals.   feeding supplement (PRO-STAT SUGAR FREE 64) Liqd Take 30 mLs by mouth 2 (two) times daily between meals. low protein, low albumin   folic acid 1 MG tablet Commonly known as:  FOLVITE Take 1 mg by mouth daily.   levothyroxine 25 MCG tablet Commonly known as:  SYNTHROID, LEVOTHROID TAKE ONE TABLET BY MOUTH ONCE DAILY   mirtazapine 7.5 MG tablet Commonly known as:  REMERON Take 7.5 mg by mouth at bedtime.   QUEtiapine 50 MG tablet Commonly known as:  SEROQUEL Take 100 mg by mouth at bedtime. 2  tabs   RISA-BID PROBIOTIC Tabs Take 1 tablet by mouth 2 (two) times daily.   sertraline 50 MG tablet Commonly known as:  ZOLOFT TAKE ONE TABLET BY MOUTH ONCE DAILY *FOLLOW  UP  WITH  PCP  FOR  MORE  REFILLS*   traMADol 50 MG tablet Commonly known as:  ULTRAM Take 50 mg by mouth every 4 (four) hours as needed.   traMADol 50 MG tablet Commonly known as:  ULTRAM Take 50 mg by mouth 4 (four) times daily. pain- scheduled. Hold for sedation   zinc gluconate 50 MG tablet Take 50 mg by mouth daily.       Review of Systems  Unable to perform ROS: Dementia  Constitutional: Negative for activity change, appetite change, chills, diaphoresis and fever.  HENT: Negative for congestion, mouth sores, nosebleeds, postnasal drip, sneezing, sore throat, trouble swallowing and voice change.   Respiratory: Negative for apnea, cough, choking, chest tightness, shortness of breath and wheezing.   Cardiovascular: Negative for chest pain, palpitations and leg swelling.  Gastrointestinal: Negative for abdominal distention, abdominal pain, constipation, diarrhea and nausea.  Genitourinary: Negative for difficulty urinating, dysuria, frequency and urgency.  Musculoskeletal: Positive for arthralgias (typical arthritis). Negative for back pain, gait problem and myalgias.  Skin: Positive for wound. Negative for color change, pallor and rash.  Neurological: Positive for weakness. Negative for dizziness, tremors, syncope, speech difficulty, numbness and headaches.  Psychiatric/Behavioral: Positive for confusion and hallucinations. Negative for agitation and behavioral problems.  All other systems reviewed and are negative.   Immunization History  Administered Date(s) Administered  . Influenza Split 06/10/2014  . Influenza,inj,Quad PF,6+ Mos 10/18/2017  . Influenza-Unspecified 04/06/2012, 06/10/2013  . Pneumococcal Conjugate-13 06/10/2014  . Pneumococcal Polysaccharide-23 04/06/2012, 10/18/2017  . Tdap  04/06/2012   Pertinent  Health Maintenance Due  Topic Date Due  . DEXA SCAN  04/05/2000  . INFLUENZA VACCINE  Completed  . PNA vac Low Risk Adult  Completed   Fall Risk  08/15/2013 07/18/2012  Falls in the past year? No No   Functional Status Survey:    Vitals:   10/29/17 1053  BP: 124/70  Pulse: 75  Resp: 20  Temp: 98 F (36.7 C)  TempSrc: Oral  SpO2: 96%  Weight: 137 lb 14.4 oz (62.6 kg)  Height: 5\' 1"  (1.549 m)   Body mass index is 26.06 kg/m. Physical Exam  Constitutional: Vital signs are normal. She appears well-developed and well-nourished. She is active and cooperative. She does not appear ill. No distress.  HENT:  Head: Normocephalic and atraumatic.  Mouth/Throat: Uvula is midline, oropharynx is clear and moist and mucous membranes are normal. Mucous membranes are not pale, not dry and not cyanotic.  Eyes: Pupils are equal, round, and reactive to light. Conjunctivae, EOM and lids are normal.  Neck: Trachea normal, normal range of motion and full passive range of motion  without pain. Neck supple. No JVD present. No tracheal deviation, no edema and no erythema present. No thyromegaly present.  Cardiovascular: Normal rate, normal heart sounds, intact distal pulses and normal pulses. An irregular rhythm present. Exam reveals no gallop, no distant heart sounds and no friction rub.  No murmur heard. Pulses:      Dorsalis pedis pulses are 2+ on the right side, and 2+ on the left side.  No edema  Pulmonary/Chest: Effort normal and breath sounds normal. No accessory muscle usage. No respiratory distress. She has no decreased breath sounds. She has no wheezes. She has no rhonchi. She has no rales. She exhibits no tenderness.  Abdominal: Soft. Normal appearance and bowel sounds are normal. She exhibits no distension and no ascites. There is no tenderness.  Musculoskeletal: She exhibits no edema.       Right hip: She exhibits decreased range of motion, decreased strength,  tenderness and laceration.  Expected osteoarthritis, stiffness; Bilateral Calves soft, supple. Negative Homan's Sign. B- pedal pulses equal  Neurological: She is alert. She has normal strength. She displays atrophy. She exhibits abnormal muscle tone. Coordination and gait abnormal.  Skin: Skin is warm and dry. Laceration (right hip with cellulitis) noted. She is not diaphoretic. No cyanosis. No pallor. Nails show no clubbing.  Psychiatric: She has a normal mood and affect. Her speech is normal. Thought content normal. She is actively hallucinating. Cognition and memory are impaired. She expresses impulsivity and inappropriate judgment. She exhibits abnormal recent memory and abnormal remote memory.  Nursing note and vitals reviewed.   Labs reviewed: Recent Labs    10/18/17 0952 10/19/17 0420 10/21/17 0530  NA 135 138 142  K 3.3* 3.3* 3.5  CL 105 107 108  CO2 23 24 27   GLUCOSE 151* 98 104*  BUN 10 10 11   CREATININE 0.75 0.70 0.76  CALCIUM 8.2* 8.5* 8.8*   Recent Labs    10/21/17 0530  AST 21  ALT 10*  ALKPHOS 62  BILITOT 0.9  PROT 5.7*  ALBUMIN 2.5*   Recent Labs    10/18/17 0952 10/19/17 0420 10/21/17 0530  WBC 6.2 6.7 6.5  NEUTROABS  --   --  4.3  HGB 9.2* 9.3* 9.4*  HCT 26.9* 27.4* 28.4*  MCV 89.3 90.0 90.7  PLT 216 205 273   Lab Results  Component Value Date   TSH 1.52 02/21/2014   No results found for: HGBA1C Lab Results  Component Value Date   CHOL 163 05/23/2013   HDL 50.90 05/23/2013   LDLCALC 84 05/23/2013   LDLDIRECT 131.7 04/06/2012   TRIG 139.0 05/23/2013   CHOLHDL 3 05/23/2013    Significant Diagnostic Results in last 30 days:  Dg Chest 1 View  Result Date: 10/17/2017 CLINICAL DATA:  Fall with hip fracture EXAM: CHEST 1 VIEW COMPARISON:  None FINDINGS: No focal pulmonary opacity or pleural effusion. Normal cardiomediastinal silhouette with aortic atherosclerosis. No pneumothorax. IMPRESSION: No active disease. Electronically Signed   By: Jasmine Pang M.D.   On: 10/17/2017 00:52   Ct Head Wo Contrast  Result Date: 10/17/2017 CLINICAL DATA:  Fall EXAM: CT HEAD WITHOUT CONTRAST CT CERVICAL SPINE WITHOUT CONTRAST TECHNIQUE: Multidetector CT imaging of the head and cervical spine was performed following the standard protocol without intravenous contrast. Multiplanar CT image reconstructions of the cervical spine were also generated. COMPARISON:  None. FINDINGS: CT HEAD FINDINGS Brain: No acute territorial infarction, haemorrhage or intracranial mass is visualized. Moderate atrophy. Mild small vessel ischemic changes of  the white matter. Vascular: No hyperdense vessels.  Carotid vascular calcification Skull: Normal. Negative for fracture or focal lesion. Sinuses/Orbits: Mild mucosal thickening in the sphenoid and ethmoid sinuses. No acute orbital abnormality Other: None CT CERVICAL SPINE FINDINGS Alignment: Reversal of cervical lordosis. Trace anterolisthesis of C3 on C4 and C4 on C5. Facet alignment within normal limits. Skull base and vertebrae: No acute fracture. No primary bone lesion or focal pathologic process. Soft tissues and spinal canal: No prevertebral fluid or swelling. No visible canal hematoma. Disc levels: Moderate degenerative changes at C5-C6 and C6-C7. Mild degenerative changes at C4-C5. Upper chest: Negative. Other: None IMPRESSION: 1. No CT evidence for acute intracranial abnormality. Atrophy and mild small vessel ischemic changes of the white matter 2. Reversal of cervical lordosis with trace anterolisthesis of C3 on C4 and C4 on C5. No definite acute fracture seen. Electronically Signed   By: Jasmine PangKim  Fujinaga M.D.   On: 10/17/2017 00:42   Ct Cervical Spine Wo Contrast  Result Date: 10/17/2017 CLINICAL DATA:  Fall EXAM: CT HEAD WITHOUT CONTRAST CT CERVICAL SPINE WITHOUT CONTRAST TECHNIQUE: Multidetector CT imaging of the head and cervical spine was performed following the standard protocol without intravenous contrast. Multiplanar CT  image reconstructions of the cervical spine were also generated. COMPARISON:  None. FINDINGS: CT HEAD FINDINGS Brain: No acute territorial infarction, haemorrhage or intracranial mass is visualized. Moderate atrophy. Mild small vessel ischemic changes of the white matter. Vascular: No hyperdense vessels.  Carotid vascular calcification Skull: Normal. Negative for fracture or focal lesion. Sinuses/Orbits: Mild mucosal thickening in the sphenoid and ethmoid sinuses. No acute orbital abnormality Other: None CT CERVICAL SPINE FINDINGS Alignment: Reversal of cervical lordosis. Trace anterolisthesis of C3 on C4 and C4 on C5. Facet alignment within normal limits. Skull base and vertebrae: No acute fracture. No primary bone lesion or focal pathologic process. Soft tissues and spinal canal: No prevertebral fluid or swelling. No visible canal hematoma. Disc levels: Moderate degenerative changes at C5-C6 and C6-C7. Mild degenerative changes at C4-C5. Upper chest: Negative. Other: None IMPRESSION: 1. No CT evidence for acute intracranial abnormality. Atrophy and mild small vessel ischemic changes of the white matter 2. Reversal of cervical lordosis with trace anterolisthesis of C3 on C4 and C4 on C5. No definite acute fracture seen. Electronically Signed   By: Jasmine PangKim  Fujinaga M.D.   On: 10/17/2017 00:42   Dg Hip Operative Unilat W Or W/o Pelvis Right  Result Date: 10/17/2017 CLINICAL DATA:  Right hip fixation EXAM: OPERATIVE RIGHT HIP (WITH PELVIS IF PERFORMED) 3 VIEWS TECHNIQUE: Fluoroscopic spot image(s) were submitted for interpretation post-operatively. COMPARISON:  10/17/2017 FINDINGS: Internal fixation across the right femoral intertrochanteric fracture. Near anatomic alignment. No hardware complicating feature. IMPRESSION: Internal fixation with near anatomic alignment. Electronically Signed   By: Charlett NoseKevin  Dover M.D.   On: 10/17/2017 13:39   Dg Hip Unilat With Pelvis 2-3 Views Right  Result Date: 10/17/2017 CLINICAL  DATA:  Hip pain after fall EXAM: DG HIP (WITH OR WITHOUT PELVIS) 2-3V RIGHT COMPARISON:  CT 02/07/2011 FINDINGS: SI joints are non widened. Pubic symphysis and rami are intact. Left femoral head projects in joint. Acute comminuted intertrochanteric fracture with displacement of lesser trochanteric fracture fragment. Cephalad migration of the distal femur. Right femoral head projects in joint. IMPRESSION: Acute comminuted and displaced intertrochanteric fracture of the right hip Electronically Signed   By: Jasmine PangKim  Fujinaga M.D.   On: 10/17/2017 00:51    Assessment/Plan Cellulitis of other specified site  Bacitracin ointment- Apply  thin film to Right Hip incisions BID after cleaning area with warm soap and water, dry well. Cover with Allevyn. May keep Allevyns in place and change Q 3 days or prn-  Closed fracture of right hip with routine healing, subsequent encounter  Continue PT/OT  Continue exercises as taught by PT/OT  Continue ice pack to right hip prn  Continue tramadol 50 mg po QID scheduled  Continue Tramadol 50 mg po Q 4 hours prn- breakthrough pain  Continue Tylenol 650 mg po QID  Continue Lovenox 40 mg SQ Q 24 hours for dvt prophylaxis  Follow up with Orthopedist as instructed  Dementia with behavioral disturbance, unspecified dementia type  Continue Aricept 10 mg po Q HS  Continue seroquel 100 mg po Q HS  Monitor closely for safety  Continue to be followed by Palliative Medicine  Hospice referral at discharge for services at home  Protein-calorie malnutrition, unspecified severity (HCC)  Continue Pro-stat 30 mL po BID  Continue Ensure Enlive 1 bottle po BID  Continue Remeron 7.5 mg po Q HS- appetite stimulant   Family/ staff Communication:   Total Time:  Documentation:  Face to Face:  Family/Phone:   Labs/tests ordered:    Medication list reviewed and assessed for continued appropriateness.  Brynda Rim, NP-C Geriatrics [facility] [phone] [address], [place] [address] Cell Phone (Mon-Fri 8am-5pm):  [phone] On Call:  [phone] & follow prompts after 5pm & weekends Office Phone:  [phone] Office Fax:  [phone]

## 2017-11-01 ENCOUNTER — Other Ambulatory Visit: Payer: Self-pay | Admitting: *Deleted

## 2017-11-01 NOTE — Patient Outreach (Signed)
[facility] Care Management  11/01/2017  Joann SiasDolores H [name] 02/14/1933 [id number]   [facility] who states that patient is active with Palliative care, however will most likely be followed by Hospice Care aftter discharge if the family agrees.   Per Cala BradfordKimberly, patient is not eligible for Medicaid due to a recent transfer of assets. Participation limited in PT and OT reported due to patient's Dementia. Per Cala BradfordKimberly, patient is total care at this time.   Plan: This social worker will follow up with patient's daughter to confirm plan for Hospice care.   Adriana ReamsChrystal Land, LCSW [facility] Care [facility] [phone]

## 2017-11-02 ENCOUNTER — Ambulatory Visit: Payer: Self-pay | Admitting: *Deleted

## 2017-11-04 ENCOUNTER — Other Ambulatory Visit
Admission: RE | Admit: 2017-11-04 | Discharge: 2017-11-04 | Disposition: A | Discharge status: Home / Self Care | Payer: PPO | Source: Ambulatory Visit | Attending: Gerontology | Admitting: Gerontology

## 2017-11-04 DIAGNOSIS — A0472 Enterocolitis due to Clostridium difficile, not specified as recurrent: Secondary | ICD-10-CM | POA: Insufficient documentation

## 2017-11-04 LAB — C DIFFICILE QUICK SCREEN W PCR REFLEX
C DIFFICLE (CDIFF) ANTIGEN: NEGATIVE
C Diff interpretation: NOT DETECTED
C Diff toxin: NEGATIVE

## 2017-11-05 ENCOUNTER — Other Ambulatory Visit: Payer: Self-pay | Admitting: *Deleted

## 2017-11-05 NOTE — Patient Outreach (Signed)
Joann [name] [facility]) Care Management  11/05/2017  Joann [name] [birth year] [id number]   Phone call to patient's daughter Joann [name] to discuss discharge plan from [facility]. Per patient's daughter patient's last covered day will be on Wednesday 11/10/17.  Patient may discharge home on Thursday  11/11/17 or they may pay out of pocket to have her stay over the weekend. Per patient's daughter, patient has accepted Hospice services which will start post discharge from the SNF. She has met with the Hospice social worker, Joann [name] and has a meeting scheduled on Monday, 11/08/17 with the discharge planner to continue to discuss discharge needs. This social worker explained to patient's daughter due to patient's Hospice involvement, [facility] will close. However, this social worker's contact information provided if patient's condition improves and [facility] care management is needed.   Joann [name] [facility] Care Management [phone]

## 2017-11-08 ENCOUNTER — Ambulatory Visit: Payer: Self-pay | Admitting: *Deleted

## 2017-11-08 ENCOUNTER — Encounter
Admission: RE | Admit: 2017-11-08 | Discharge: 2017-11-08 | Disposition: A | Discharge status: Home / Self Care | Payer: PPO | Source: Ambulatory Visit | Attending: Internal Medicine | Admitting: Internal Medicine

## 2017-11-10 ENCOUNTER — Non-Acute Institutional Stay (SKILLED_NURSING_FACILITY): Payer: PPO | Admitting: Gerontology

## 2017-11-10 ENCOUNTER — Encounter: Payer: Self-pay | Admitting: Gerontology

## 2017-11-10 DIAGNOSIS — Z9889 Other specified postprocedural states: Secondary | ICD-10-CM | POA: Diagnosis not present

## 2017-11-10 DIAGNOSIS — L03818 Cellulitis of other sites: Secondary | ICD-10-CM | POA: Diagnosis not present

## 2017-11-10 DIAGNOSIS — S72001D Fracture of unspecified part of neck of right femur, subsequent encounter for closed fracture with routine healing: Secondary | ICD-10-CM

## 2017-11-10 DIAGNOSIS — F0391 Unspecified dementia with behavioral disturbance: Secondary | ICD-10-CM | POA: Diagnosis not present

## 2017-11-10 DIAGNOSIS — E46 Unspecified protein-calorie malnutrition: Secondary | ICD-10-CM

## 2017-11-10 NOTE — Progress Notes (Signed)
Location:   [facility] Room Number: 208A Place of Service:  SNF [phone]  Provider: Lorenso QuarryShannon Larah Kuntzman, NP-C  PCP: System, Pcp Not In Patient Care Team: System, Pcp Not In as PCP - General  Extended Emergency Contact Information Primary Emergency Contact: Rosanne GuttingSutton, Lisa Mobile Phone: [phone] Relation: Daughter Secondary Emergency Contact: Evelena PeatWarren,Phyllis  [place] Home Phone: [phone] Relation: None  Code Status: DNR Goals of care:  Advanced Directive information Advanced Directives 11/10/2017  Does Patient Have a Medical Advance Directive? Yes  Type of Estate agentAdvance Directive Healthcare Power of [place]andy[place]orney;Living will;Out of facility DNR (pink MOST or yellow form)  Does patient want to make changes to medical advance directive? No - Patient declined  Copy of Healthcare Power of Attorney in Chart? Yes     Allergies  Allergen Reactions  . Codeine Other (See Comments)    Paralysis     Chief Complaint  Patient presents with  . Discharge Note    Discharged from SNF    HPI:  84 y.o. female seen today for discharge evaluation. Pt was admitted to the facility for rehab following [facility] for Right Hip fracture with subsequent Hip Pinning. Pt has been participating in PT/OT somewhat. Her therapy progress is inhibited due to her underlying dementia. Pt also had a bout with C-Diff. She was on PO Vancomycin. Repeat stool culture is negative for C-Diff. Pt has had a poor appetite while here with weight loss. She is on supplements, but continues to lose weight. The hip incision developed cellulitis early on. It was treated with Keflex with complete resolution of symptoms. Pt has had progressive, persistent confusion. Palliative Care was following pt while here on rehab. Pt qualifies for Hospice after discharge. Plan is to discharge home with Hospice Services. Pt is on scheduled pain medications d/t inability to call for prn doses. Pt has not  exhibited any sign of sedation/oversedation. Pt denies pain at this time. VSS. No other complaints.     Please note pt with limited verbal ability. Unable to obtain complete ROS. Some ROS info obtained from staff and documentation.   Past Medical History:  Diagnosis Date  . Anxiety   . Anxiety disorder   . Arthritis   . Chicken pox   . Colon polyp   . GERD (gastroesophageal reflux disease)   . Hyperlipidemia   . Hypertension   . Macular degeneration    Dr. [place]ne [place]ers in [place]  . Thyroid disease     Past Surgical History:  Procedure Laterality Date  . APPENDECTOMY  1942  . INTRAMEDULLARY (IM) NAIL INTERTROCHANTERIC Right 10/17/2017   Procedure: INTRAMEDULLARY (IM) NAIL INTERTROCHANTRIC;  Surgeon: Deeann SaintMiller, Howard, MD;  Location: [facility] ORS;  Service: Orthopedics;  Laterality: Right;  . KNEE DISLOCATION SURGERY Right 1996  . TOTAL ABDOMINAL HYSTERECTOMY W/ BILATERAL SALPINGOOPHORECTOMY  1971      reports that she has been smoking cigarettes.  She has been smoking about 0.50 packs per day. She has never used smokeless tobacco. She reports that she drinks alcohol. She reports that she does not use drugs. Social History   Socioeconomic History  . Marital status: Widowed    Spouse name: Not on file  . Number of children: Not on file  . Years of education: 1012  . Highest education level: High school graduate  Occupational History  . Not on file  Social Needs  . Financial resource strain: Not on file  . Food insecurity:    Worry: Not on  file    Inability: Not on file  . Transportation needs:    Medical: Not on file    Non-medical: Not on file  Tobacco Use  . Smoking status: Current Every Day Smoker    Packs/day: 0.50    Types: Cigarettes  . Smokeless tobacco: Never Used  Substance and Sexual Activity  . Alcohol use: Yes    Comment: rarely  . Drug use: No  . Sexual activity: Not Currently  Lifestyle  . Physical activity:    Days per week: Not on file    Minutes per  session: Not on file  . Stress: Not on file  Relationships  . Social connections:    Talks on phone: Not on file    Gets together: Not on file    Attends religious service: Not on file    Active member of club or organization: Not on file    Attends meetings of clubs or organizations: Not on file    Relationship status: Not on file  . Intimate partner violence:    Fear of current or ex partner: Not on file    Emotionally abused: Not on file    Physically abused: Not on file    Forced sexual activity: Not on file  Other Topics Concern  . Not on file  Social History Narrative   Lives in [place].      Retired - Worked as Games developer Status Survey:    Allergies  Allergen Reactions  . Codeine Other (See Comments)    Paralysis     Pertinent  Health Maintenance Due  Topic Date Due  . DEXA SCAN  04/05/2000  . INFLUENZA VACCINE  03/10/2018  . PNA vac Low Risk Adult  Completed    Medications: Allergies as of 11/10/2017      Reactions   Codeine Other (See Comments)   Paralysis       Medication List        Accurate as of 11/10/17 10:28 AM. Always use your most recent med list.          acetaminophen 325 MG tablet Commonly known as:  TYLENOL Take 650 mg by mouth 4 (four) times daily.   ASPERCREME LIDOCAINE 4 % Ptch Generic drug:  Lidocaine Apply 1 patch topically daily. Apply to right hip daily for pain. Remove after 12 hours   Cholecalciferol 4000 units Caps Take 1 capsule by mouth daily.   donepezil 10 MG tablet Commonly known as:  ARICEPT Take 10 mg by mouth at bedtime.   ENSURE ENLIVE PO Take 1 Bottle by mouth 2 (two) times daily between meals.   feeding supplement (PRO-STAT SUGAR FREE 64) Liqd Take 30 mLs by mouth 2 (two) times daily between meals. low protein, low albumin   folic acid 1 MG tablet Commonly known as:  FOLVITE Take 1 mg by mouth daily.   levothyroxine 25 MCG tablet Commonly known as:  SYNTHROID, LEVOTHROID TAKE ONE  TABLET BY MOUTH ONCE DAILY   mirtazapine 7.5 MG tablet Commonly known as:  REMERON Take 7.5 mg by mouth at bedtime.   QUEtiapine 50 MG tablet Commonly known as:  SEROQUEL Take 100 mg by mouth at bedtime. 2 tabs   RISA-BID PROBIOTIC Tabs Take 1 tablet by mouth 2 (two) times daily.   sertraline 50 MG tablet Commonly known as:  ZOLOFT TAKE ONE TABLET BY MOUTH ONCE DAILY *FOLLOW  UP  WITH  PCP  FOR  MORE  REFILLS*   traMADol 50  MG tablet Commonly known as:  ULTRAM Take 50 mg by mouth every 4 (four) hours as needed.   traMADol 50 MG tablet Commonly known as:  ULTRAM Take 50 mg by mouth 2 (two) times daily. pain- scheduled. Hold for sedation   zinc gluconate 50 MG tablet Take 50 mg by mouth daily.       Review of Systems  Unable to perform ROS: Dementia  Constitutional: Negative for activity change, appetite change, chills, diaphoresis and fever.  HENT: Negative for congestion, mouth sores, nosebleeds, postnasal drip, sneezing, sore throat, trouble swallowing and voice change.   Respiratory: Negative for apnea, cough, choking, chest tightness, shortness of breath and wheezing.   Cardiovascular: Negative for chest pain, palpitations and leg swelling.  Gastrointestinal: Negative for abdominal distention, abdominal pain, constipation, diarrhea and nausea.  Genitourinary: Negative for difficulty urinating, dysuria, frequency and urgency.  Musculoskeletal: Positive for arthralgias (typical arthritis), gait problem and myalgias. Negative for back pain.  Skin: Negative for color change, pallor, rash and wound.  Neurological: Positive for weakness. Negative for dizziness, tremors, syncope, speech difficulty, numbness and headaches.  Psychiatric/Behavioral: Positive for confusion and dysphoric mood. Negative for agitation and behavioral problems.  All other systems reviewed and are negative.   Vitals:   11/10/17 1017  BP: 123/67  Pulse: 87  Resp: 18  Temp: 97.7 F (36.5 C)    TempSrc: Oral  SpO2: 98%  Weight: 121 lb 3.2 oz (55 kg)  Height: 5\' 1"  (1.549 m)   Body mass index is 22.9 kg/m. Physical Exam  Constitutional: Vital signs are normal. She appears well-developed and well-nourished. She is active and cooperative. She does not appear ill. No distress.  HENT:  Head: Normocephalic and atraumatic.  Mouth/Throat: Uvula is midline, oropharynx is clear and moist and mucous membranes are normal. Mucous membranes are not pale, not dry and not cyanotic.  Eyes: Pupils are equal, round, and reactive to light. Conjunctivae, EOM and lids are normal.  Poor eye sight.  Neck: Trachea normal, normal range of motion and full passive range of motion without pain. Neck supple. No JVD present. No tracheal deviation, no edema and no erythema present. No thyromegaly present.  Cardiovascular: Normal rate, regular rhythm, normal heart sounds, intact distal pulses and normal pulses. Exam reveals no gallop, no distant heart sounds and no friction rub.  No murmur heard. Pulses:      Dorsalis pedis pulses are 2+ on the right side, and 2+ on the left side.  No edema  Pulmonary/Chest: Effort normal and breath sounds normal. No accessory muscle usage. No respiratory distress. She has no decreased breath sounds. She has no wheezes. She has no rhonchi. She has no rales. She exhibits no tenderness.  Abdominal: Soft. Normal appearance and bowel sounds are normal. She exhibits no distension and no ascites. There is no tenderness.  Musculoskeletal: She exhibits no edema or tenderness.       Right hip: She exhibits decreased range of motion, decreased strength and laceration.  Expected osteoarthritis, stiffness; Bilateral Calves soft, supple. Negative Homan's Sign. B- pedal pulses equal  Neurological: She is alert. She has normal strength. A cranial nerve deficit is present. She exhibits abnormal muscle tone. Coordination and gait abnormal.  Skin: Skin is warm, dry and intact. She is not  diaphoretic. No cyanosis. No pallor. Nails show no clubbing.  Psychiatric: She has a normal mood and affect. Her speech is normal. Thought content normal. She is slowed. Cognition and memory are impaired. She expresses impulsivity. She  exhibits abnormal recent memory and abnormal remote memory.  Nursing note and vitals reviewed.   Labs reviewed: Basic Metabolic Panel: Recent Labs    10/18/17 0952 10/19/17 0420 10/21/17 0530  NA 135 138 142  K 3.3* 3.3* 3.5  CL 105 107 108  CO2 23 24 27   GLUCOSE 151* 98 104*  BUN 10 10 11   CREATININE 0.75 0.70 0.76  CALCIUM 8.2* 8.5* 8.8*   Liver Function Tests: Recent Labs    10/21/17 0530  AST 21  ALT 10*  ALKPHOS 62  BILITOT 0.9  PROT 5.7*  ALBUMIN 2.5*   No results for input(s): LIPASE, AMYLASE in the last 8760 hours. No results for input(s): AMMONIA in the last 8760 hours. CBC: Recent Labs    10/18/17 0952 10/19/17 0420 10/21/17 0530  WBC 6.2 6.7 6.5  NEUTROABS  --   --  4.3  HGB 9.2* 9.3* 9.4*  HCT 26.9* 27.4* 28.4*  MCV 89.3 90.0 90.7  PLT 216 205 273   Cardiac Enzymes: Recent Labs    10/16/17 2355  TROPONINI <0.03   BNP: Invalid input(s): POCBNP CBG: No results for input(s): GLUCAP in the last 8760 hours.  Procedures and Imaging Studies During Stay: Dg Chest 1 View  Result Date: 10/17/2017 CLINICAL DATA:  Fall with hip fracture EXAM: CHEST 1 VIEW COMPARISON:  None FINDINGS: No focal pulmonary opacity or pleural effusion. Normal cardiomediastinal silhouette with aortic atherosclerosis. No pneumothorax. IMPRESSION: No active disease. Electronically Signed   By: Jasmine Pang M.D.   On: 10/17/2017 00:52   Ct Head Wo Contrast  Result Date: 10/17/2017 CLINICAL DATA:  Fall EXAM: CT HEAD WITHOUT CONTRAST CT CERVICAL SPINE WITHOUT CONTRAST TECHNIQUE: Multidetector CT imaging of the head and cervical spine was performed following the standard protocol without intravenous contrast. Multiplanar CT image reconstructions of  the cervical spine were also generated. COMPARISON:  None. FINDINGS: CT HEAD FINDINGS Brain: No acute territorial infarction, haemorrhage or intracranial mass is visualized. Moderate atrophy. Mild small vessel ischemic changes of the white matter. Vascular: No hyperdense vessels.  Carotid vascular calcification Skull: Normal. Negative for fracture or focal lesion. Sinuses/Orbits: Mild mucosal thickening in the sphenoid and ethmoid sinuses. No acute orbital abnormality Other: None CT CERVICAL SPINE FINDINGS Alignment: Reversal of cervical lordosis. Trace anterolisthesis of C3 on C4 and C4 on C5. Facet alignment within normal limits. Skull base and vertebrae: No acute fracture. No primary bone lesion or focal pathologic process. Soft tissues and spinal canal: No prevertebral fluid or swelling. No visible canal hematoma. Disc levels: Moderate degenerative changes at C5-C6 and C6-C7. Mild degenerative changes at C4-C5. Upper chest: Negative. Other: None IMPRESSION: 1. No CT evidence for acute intracranial abnormality. Atrophy and mild small vessel ischemic changes of the white matter 2. Reversal of cervical lordosis with trace anterolisthesis of C3 on C4 and C4 on C5. No definite acute fracture seen. Electronically Signed   By: Jasmine Pang M.D.   On: 10/17/2017 00:42   Ct Cervical Spine Wo Contrast  Result Date: 10/17/2017 CLINICAL DATA:  Fall EXAM: CT HEAD WITHOUT CONTRAST CT CERVICAL SPINE WITHOUT CONTRAST TECHNIQUE: Multidetector CT imaging of the head and cervical spine was performed following the standard protocol without intravenous contrast. Multiplanar CT image reconstructions of the cervical spine were also generated. COMPARISON:  None. FINDINGS: CT HEAD FINDINGS Brain: No acute territorial infarction, haemorrhage or intracranial mass is visualized. Moderate atrophy. Mild small vessel ischemic changes of the white matter. Vascular: No hyperdense vessels.  Carotid vascular calcification  Skull: Normal.  Negative for fracture or focal lesion. Sinuses/Orbits: Mild mucosal thickening in the sphenoid and ethmoid sinuses. No acute orbital abnormality Other: None CT CERVICAL SPINE FINDINGS Alignment: Reversal of cervical lordosis. Trace anterolisthesis of C3 on C4 and C4 on C5. Facet alignment within normal limits. Skull base and vertebrae: No acute fracture. No primary bone lesion or focal pathologic process. Soft tissues and spinal canal: No prevertebral fluid or swelling. No visible canal hematoma. Disc levels: Moderate degenerative changes at C5-C6 and C6-C7. Mild degenerative changes at C4-C5. Upper chest: Negative. Other: None IMPRESSION: 1. No CT evidence for acute intracranial abnormality. Atrophy and mild small vessel ischemic changes of the white matter 2. Reversal of cervical lordosis with trace anterolisthesis of C3 on C4 and C4 on C5. No definite acute fracture seen. Electronically Signed   By: Jasmine Pang M.D.   On: 10/17/2017 00:42   Dg Hip Operative Unilat W Or W/o Pelvis Right  Result Date: 10/17/2017 CLINICAL DATA:  Right hip fixation EXAM: OPERATIVE RIGHT HIP (WITH PELVIS IF PERFORMED) 3 VIEWS TECHNIQUE: Fluoroscopic spot image(s) were submitted for interpretation post-operatively. COMPARISON:  10/17/2017 FINDINGS: Internal fixation across the right femoral intertrochanteric fracture. Near anatomic alignment. No hardware complicating feature. IMPRESSION: Internal fixation with near anatomic alignment. Electronically Signed   By: Charlett Nose M.D.   On: 10/17/2017 13:39   Dg Hip Unilat With Pelvis 2-3 Views Right  Result Date: 10/17/2017 CLINICAL DATA:  Hip pain after fall EXAM: DG HIP (WITH OR WITHOUT PELVIS) 2-3V RIGHT COMPARISON:  CT 02/07/2011 FINDINGS: SI joints are non widened. Pubic symphysis and rami are intact. Left femoral head projects in joint. Acute comminuted intertrochanteric fracture with displacement of lesser trochanteric fracture fragment. Cephalad migration of the distal  femur. Right femoral head projects in joint. IMPRESSION: Acute comminuted and displaced intertrochanteric fracture of the right hip Electronically Signed   By: Jasmine Pang M.D.   On: 10/17/2017 00:51    Assessment/Plan:   There are no diagnoses linked to this encounter.   Patient is being discharged with the following home health services: [facility]   Patient is being discharged with the following durable medical equipment: wheelchair, hospital bed, over-bed table, BSC, Geri-chair   Patient has been advised to f/u with their PCP in 1-2 weeks to bring them up to date on their rehab stay.  Social services at facility was responsible for arranging this appointment.  Pt was provided with a 30 day supply of prescriptions for medications and refills must be obtained from their PCP.  For controlled substances, a more limited supply may be provided adequate until PCP appointment only.  Future labs/tests needed:    Family/ staff Communication:   Total Time:  Documentation:  Face to Face:  Family/Phone:  Brynda Rim, NP-C [facility] [address], [place] [address] Cell Phone (Mon-Fri 8am-5pm):  [phone] On Call:  [phone] & follow prompts after 5pm & weekends Office Phone:  [phone] Office Fax:  [phone]

## 2017-11-27 DIAGNOSIS — F419 Anxiety disorder, unspecified: Secondary | ICD-10-CM | POA: Diagnosis not present

## 2017-11-27 DIAGNOSIS — E039 Hypothyroidism, unspecified: Secondary | ICD-10-CM | POA: Diagnosis not present

## 2017-11-27 DIAGNOSIS — E46 Unspecified protein-calorie malnutrition: Secondary | ICD-10-CM | POA: Diagnosis not present

## 2017-11-27 DIAGNOSIS — M1990 Unspecified osteoarthritis, unspecified site: Secondary | ICD-10-CM | POA: Diagnosis not present

## 2017-11-27 DIAGNOSIS — I10 Essential (primary) hypertension: Secondary | ICD-10-CM | POA: Diagnosis not present

## 2017-11-27 DIAGNOSIS — S72141D Displaced intertrochanteric fracture of right femur, subsequent encounter for closed fracture with routine healing: Secondary | ICD-10-CM | POA: Diagnosis not present

## 2017-11-27 DIAGNOSIS — R634 Abnormal weight loss: Secondary | ICD-10-CM | POA: Diagnosis not present

## 2017-11-27 DIAGNOSIS — F0390 Unspecified dementia without behavioral disturbance: Secondary | ICD-10-CM | POA: Diagnosis not present

## 2017-11-27 DIAGNOSIS — K219 Gastro-esophageal reflux disease without esophagitis: Secondary | ICD-10-CM | POA: Diagnosis not present

## 2017-11-27 DIAGNOSIS — H3530 Unspecified macular degeneration: Secondary | ICD-10-CM | POA: Diagnosis not present

## 2017-12-27 ENCOUNTER — Other Ambulatory Visit: Payer: Self-pay

## 2017-12-27 ENCOUNTER — Emergency Department

## 2017-12-27 ENCOUNTER — Inpatient Hospital Stay
Admission: EM | Admit: 2017-12-27 | Discharge: 2017-12-29 | DRG: 534 | Disposition: A | Discharge status: Hospice | Attending: Internal Medicine | Admitting: Internal Medicine

## 2017-12-27 ENCOUNTER — Encounter: Payer: Self-pay | Admitting: Emergency Medicine

## 2017-12-27 DIAGNOSIS — E785 Hyperlipidemia, unspecified: Secondary | ICD-10-CM | POA: Diagnosis present

## 2017-12-27 DIAGNOSIS — Z66 Do not resuscitate: Secondary | ICD-10-CM | POA: Diagnosis present

## 2017-12-27 DIAGNOSIS — Z885 Allergy status to narcotic agent status: Secondary | ICD-10-CM

## 2017-12-27 DIAGNOSIS — F17210 Nicotine dependence, cigarettes, uncomplicated: Secondary | ICD-10-CM | POA: Diagnosis present

## 2017-12-27 DIAGNOSIS — M1990 Unspecified osteoarthritis, unspecified site: Secondary | ICD-10-CM | POA: Diagnosis present

## 2017-12-27 DIAGNOSIS — Z820 Family history of epilepsy and other diseases of the nervous system: Secondary | ICD-10-CM | POA: Diagnosis not present

## 2017-12-27 DIAGNOSIS — Z7401 Bed confinement status: Secondary | ICD-10-CM | POA: Diagnosis not present

## 2017-12-27 DIAGNOSIS — I10 Essential (primary) hypertension: Secondary | ICD-10-CM | POA: Diagnosis not present

## 2017-12-27 DIAGNOSIS — S72301A Unspecified fracture of shaft of right femur, initial encounter for closed fracture: Principal | ICD-10-CM | POA: Diagnosis present

## 2017-12-27 DIAGNOSIS — K219 Gastro-esophageal reflux disease without esophagitis: Secondary | ICD-10-CM | POA: Diagnosis present

## 2017-12-27 DIAGNOSIS — Z809 Family history of malignant neoplasm, unspecified: Secondary | ICD-10-CM | POA: Diagnosis not present

## 2017-12-27 DIAGNOSIS — F0391 Unspecified dementia with behavioral disturbance: Secondary | ICD-10-CM | POA: Diagnosis present

## 2017-12-27 DIAGNOSIS — F03918 Unspecified dementia, unspecified severity, with other behavioral disturbance: Secondary | ICD-10-CM | POA: Diagnosis present

## 2017-12-27 DIAGNOSIS — F419 Anxiety disorder, unspecified: Secondary | ICD-10-CM | POA: Diagnosis present

## 2017-12-27 DIAGNOSIS — Z86010 Personal history of colonic polyps: Secondary | ICD-10-CM

## 2017-12-27 DIAGNOSIS — S72001A Fracture of unspecified part of neck of right femur, initial encounter for closed fracture: Secondary | ICD-10-CM | POA: Diagnosis not present

## 2017-12-27 DIAGNOSIS — E039 Hypothyroidism, unspecified: Secondary | ICD-10-CM | POA: Diagnosis present

## 2017-12-27 DIAGNOSIS — H3530 Unspecified macular degeneration: Secondary | ICD-10-CM | POA: Diagnosis present

## 2017-12-27 DIAGNOSIS — Z90710 Acquired absence of both cervix and uterus: Secondary | ICD-10-CM | POA: Diagnosis not present

## 2017-12-27 DIAGNOSIS — S72341A Displaced spiral fracture of shaft of right femur, initial encounter for closed fracture: Secondary | ICD-10-CM | POA: Diagnosis not present

## 2017-12-27 DIAGNOSIS — M25551 Pain in right hip: Secondary | ICD-10-CM | POA: Diagnosis not present

## 2017-12-27 DIAGNOSIS — Z79890 Hormone replacement therapy (postmenopausal): Secondary | ICD-10-CM | POA: Diagnosis not present

## 2017-12-27 DIAGNOSIS — Z8249 Family history of ischemic heart disease and other diseases of the circulatory system: Secondary | ICD-10-CM | POA: Diagnosis not present

## 2017-12-27 DIAGNOSIS — W1830XA Fall on same level, unspecified, initial encounter: Secondary | ICD-10-CM | POA: Diagnosis present

## 2017-12-27 DIAGNOSIS — W19XXXA Unspecified fall, initial encounter: Secondary | ICD-10-CM | POA: Diagnosis not present

## 2017-12-27 LAB — BASIC METABOLIC PANEL
ANION GAP: 7 (ref 5–15)
BUN: 18 mg/dL (ref 6–20)
CALCIUM: 9.1 mg/dL (ref 8.9–10.3)
CO2: 26 mmol/L (ref 22–32)
Chloride: 106 mmol/L (ref 101–111)
Creatinine, Ser: 0.84 mg/dL (ref 0.44–1.00)
GFR calc Af Amer: >60 mL/min (ref >60)
GLUCOSE: 116 mg/dL — AB (ref 65–99)
Potassium: 4 mmol/L (ref 3.5–5.1)
Sodium: 139 mmol/L (ref 135–145)

## 2017-12-27 LAB — CBC
HCT: 38.7 % (ref 35.0–47.0)
Hemoglobin: 13.2 g/dL (ref 12.0–16.0)
MCH: 31.1 pg (ref 26.0–34.0)
MCHC: 34 g/dL (ref 32.0–36.0)
MCV: 91.4 fL (ref 80.0–100.0)
PLATELETS: 278 10*3/uL (ref 150–440)
RBC: 4.23 MIL/uL (ref 3.80–5.20)
RDW: 16.4 % — AB (ref 11.5–14.5)
WBC: 9.5 10*3/uL (ref 3.6–11.0)

## 2017-12-27 MED ORDER — OXYCODONE HCL 5 MG PO TABS
5.0000 mg | ORAL_TABLET | ORAL | Status: DC | PRN
  Administered 2017-12-27 – 2017-12-29 (×5): 5 mg via ORAL
  Filled 2017-12-27 (×5): qty 1

## 2017-12-27 MED ORDER — ONDANSETRON HCL 4 MG PO TABS: 4.0000 mg | ORAL_TABLET | Freq: Four times a day (QID) | ORAL | Status: DC | PRN

## 2017-12-27 MED ORDER — FENTANYL CITRATE (PF) 100 MCG/2ML IJ SOLN
INTRAMUSCULAR | Status: AC
  Filled 2017-12-27: qty 2

## 2017-12-27 MED ORDER — ONDANSETRON HCL 4 MG/2ML IJ SOLN
4.0000 mg | Freq: Once | INTRAMUSCULAR | Status: AC
  Administered 2017-12-27: 4 mg via INTRAVENOUS
  Filled 2017-12-27: qty 2

## 2017-12-27 MED ORDER — ONDANSETRON HCL 4 MG/2ML IJ SOLN: 4.0000 mg | Freq: Four times a day (QID) | INTRAMUSCULAR | Status: DC | PRN

## 2017-12-27 MED ORDER — ACETAMINOPHEN 325 MG PO TABS: 650.0000 mg | ORAL_TABLET | Freq: Four times a day (QID) | ORAL | Status: DC | PRN

## 2017-12-27 MED ORDER — SERTRALINE HCL 50 MG PO TABS
50.0000 mg | ORAL_TABLET | Freq: Every day | ORAL | Status: DC
  Administered 2017-12-28 – 2017-12-29 (×2): 50 mg via ORAL
  Filled 2017-12-27 (×2): qty 1

## 2017-12-27 MED ORDER — ACETAMINOPHEN 650 MG RE SUPP: 650.0000 mg | Freq: Four times a day (QID) | RECTAL | Status: DC | PRN

## 2017-12-27 MED ORDER — FENTANYL CITRATE (PF) 100 MCG/2ML IJ SOLN
100.0000 ug | Freq: Once | INTRAMUSCULAR | Status: AC
  Administered 2017-12-27: 100 ug via INTRAVENOUS

## 2017-12-27 MED ORDER — QUETIAPINE FUMARATE 25 MG PO TABS
100.0000 mg | ORAL_TABLET | Freq: Every day | ORAL | Status: DC
  Administered 2017-12-27 – 2017-12-28 (×2): 100 mg via ORAL
  Filled 2017-12-27 (×2): qty 4

## 2017-12-27 MED ORDER — MORPHINE SULFATE (PF) 4 MG/ML IV SOLN: 4.0000 mg | INTRAVENOUS | Status: DC | PRN

## 2017-12-27 MED ORDER — MIRTAZAPINE 15 MG PO TABS
7.5000 mg | ORAL_TABLET | Freq: Every day | ORAL | Status: DC
  Administered 2017-12-28: 7.5 mg via ORAL
  Filled 2017-12-27: qty 1

## 2017-12-27 MED ORDER — MORPHINE SULFATE (PF) 4 MG/ML IV SOLN
4.0000 mg | Freq: Once | INTRAVENOUS | Status: AC
  Administered 2017-12-27: 4 mg via INTRAVENOUS
  Filled 2017-12-27: qty 1

## 2017-12-27 MED ORDER — LEVOTHYROXINE SODIUM 25 MCG PO TABS
25.0000 ug | ORAL_TABLET | Freq: Every day | ORAL | Status: DC
  Administered 2017-12-28: 25 ug via ORAL
  Filled 2017-12-27: qty 1

## 2017-12-27 MED ORDER — DONEPEZIL HCL 5 MG PO TABS
10.0000 mg | ORAL_TABLET | Freq: Every day | ORAL | Status: DC
  Administered 2017-12-28: 10 mg via ORAL
  Filled 2017-12-27 (×3): qty 2

## 2017-12-27 MED ORDER — SODIUM CHLORIDE 0.9 % IV SOLN
INTRAVENOUS | Status: AC
  Administered 2017-12-28: via INTRAVENOUS

## 2017-12-27 NOTE — ED Notes (Signed)
Pt to xray

## 2017-12-27 NOTE — ED Notes (Signed)
Patient transported to room 152 by this EDT.

## 2017-12-27 NOTE — H&P (Signed)
[facility] - [place] at [facility]   PATIENT NAME: Joann [name]    MR#:  [id number]  DATE OF BIRTH:  1933/03/28  DATE OF ADMISSION:  12/27/2017  PRIMARY CARE PHYSICIAN: Patrice Paradise, MD   REQUESTING/REFERRING PHYSICIAN: Lenard Lance, MD  CHIEF COMPLAINT:   Chief Complaint  Patient presents with  . Fall  . Hip Injury    3 weeks post op right hip fracture     HISTORY OF PRESENT ILLNESS:  Joann [name]  is a 84 y.o. female who presents with fall and right hip pain.  Patient had right hip fracture repaired surgically several weeks ago.  She fell again today and imaging shows peri-prosthetic fracture.  Orthopedic surgery contacted by ED physician and requests CT hip to evaluate need for repeat operative repair.  Hospitalist was called for admission.  PAST MEDICAL HISTORY:   Past Medical History:  Diagnosis Date  . Anxiety   . Anxiety disorder   . Arthritis   . Chicken pox   . Colon polyp   . GERD (gastroesophageal reflux disease)   . Hyperlipidemia   . Hypertension   . Macular degeneration    Dr. Allyne Gee in [place]  . Thyroid disease      PAST SURGICAL HISTORY:   Past Surgical History:  Procedure Laterality Date  . APPENDECTOMY  1942  . INTRAMEDULLARY (IM) NAIL INTERTROCHANTERIC Right 10/17/2017   Procedure: INTRAMEDULLARY (IM) NAIL INTERTROCHANTRIC;  Surgeon: Deeann Saint, MD;  Location: [facility] ORS;  Service: Orthopedics;  Laterality: Right;  . KNEE DISLOCATION SURGERY Right 1996  . TOTAL ABDOMINAL HYSTERECTOMY W/ BILATERAL SALPINGOOPHORECTOMY  1971     SOCIAL HISTORY:   Social History   Tobacco Use  . Smoking status: Current Every Day Smoker    Packs/day: 0.50    Types: Cigarettes  . Smokeless tobacco: Never Used  Substance Use Topics  . Alcohol use: Yes    Comment: rarely     FAMILY HISTORY:   Family History  Problem Relation Age of Onset  . Heart disease Mother   . Hypertension Mother   . Parkinsonism  Mother   . Heart disease Father   . Hypertension Father   . Cancer Father        bone  . Heart disease Brother      DRUG ALLERGIES:   Allergies  Allergen Reactions  . Codeine Other (See Comments)    Paralysis     MEDICATIONS AT HOME:   Prior to Admission medications   Medication Sig Start Date End Date Taking? Authorizing Provider  acetaminophen (TYLENOL) 325 MG tablet Take 650 mg by mouth 4 (four) times daily.   Yes [provider]  Cholecalciferol 4000 units CAPS Take 1 capsule by mouth daily.   Yes [provider]  donepezil (ARICEPT) 10 MG tablet Take 10 mg by mouth at bedtime.  09/13/17  Yes [provider]  folic acid (FOLVITE) 1 MG tablet Take 1 mg by mouth daily.   Yes [provider]  levothyroxine (SYNTHROID, LEVOTHROID) 25 MCG tablet TAKE ONE TABLET BY MOUTH ONCE DAILY 01/02/16  Yes Shelia Media, MD  Lidocaine (ASPERCREME LIDOCAINE) 4 % PTCH Apply 1 patch topically daily. Apply to right hip daily for pain. Remove after 12 hours   Yes [provider]  mirtazapine (REMERON) 7.5 MG tablet Take 7.5 mg by mouth at bedtime.  09/13/17  Yes [provider]  Probiotic Product (RISA-BID PROBIOTIC) TABS Take 1 tablet by mouth 2 (two) times  daily.   Yes [provider]  QUEtiapine (SEROQUEL) 50 MG tablet Take 100 mg by mouth at bedtime. 2 tabs 09/14/17  Yes [provider]  sertraline (ZOLOFT) 50 MG tablet TAKE ONE TABLET BY MOUTH ONCE DAILY *FOLLOW  UP  WITH  PCP  FOR  MORE  REFILLS* 06/04/16  Yes Cook, Jayce G, DO  traMADol (ULTRAM) 50 MG tablet Take 50 mg by mouth 2 (two) times daily. pain- scheduled. Hold for sedation    Yes [provider]  zinc gluconate 50 MG tablet Take 50 mg by mouth daily.   Yes [provider]  Amino Acids-Protein Hydrolys (FEEDING SUPPLEMENT, PRO-STAT SUGAR FREE 64,) LIQD Take 30 mLs by mouth 2 (two) times daily between meals. low protein, low albumin    [provider]  Nutritional Supplements (ENSURE ENLIVE PO) Take 1 Bottle by mouth 2 (two) times daily between meals.    [provider]    REVIEW OF SYSTEMS:  Review of Systems  Constitutional: Negative for chills, fever, malaise/fatigue and weight loss.  HENT: Negative for ear pain, hearing loss and tinnitus.   Eyes: Negative for blurred vision, double vision, pain and redness.  Respiratory: Negative for cough, hemoptysis and shortness of breath.   Cardiovascular: Negative for chest pain, palpitations, orthopnea and leg swelling.  Gastrointestinal: Negative for abdominal pain, constipation, diarrhea, nausea and vomiting.  Genitourinary: Negative for dysuria, frequency and hematuria.  Musculoskeletal: Positive for joint pain (right hip). Negative for back pain and neck pain.  Skin:       No acne, rash, or lesions  Neurological: Negative for dizziness, tremors, focal weakness and weakness.  Endo/Heme/Allergies: Negative for polydipsia. Does not bruise/bleed easily.  Psychiatric/Behavioral: Negative for depression. The patient is not nervous/anxious and does not have insomnia.      VITAL SIGNS:   Vitals:   12/27/17 2057 12/27/17 2100 12/27/17 2115 12/27/17 2224  BP:  (!) 145/60 105/63 (!) 154/72  Pulse:  67 71 71  Resp:  Temp:      TempSrc:      SpO2:  94% 95% 97%  Weight: 61.2 kg (135 lb)     Height:  (1.549 m)      Wt Readings from Last 3 Encounters:  12/27/17 61.2 kg (135 lb)  11/10/17 55 kg (121 lb 3.2 oz)  10/29/17 62.6 kg (137 lb 14.4 oz)    PHYSICAL EXAMINATION:  Physical Exam  Vitals reviewed. Constitutional: She is oriented to person, place, and time. She appears well-developed and well-nourished. No distress.  HENT:  Head: Normocephalic and atraumatic.  Mouth/Throat: Oropharynx is clear and moist.  Eyes: Pupils are equal, round, and reactive to light. Conjunctivae and EOM are normal. No scleral icterus.  Neck: Normal range of motion.  Neck supple. No JVD present. No thyromegaly present.  Cardiovascular: Normal rate, regular rhythm and intact distal pulses. Exam reveals no gallop and no friction rub.  No murmur heard. Respiratory: Effort normal and breath sounds normal. No respiratory distress. She has no wheezes. She has no rales.  GI: Soft. Bowel sounds are normal. She exhibits no distension. There is no tenderness.  Musculoskeletal: Normal range of motion. She exhibits tenderness (right hip). She exhibits no edema.  No arthritis, no gout  Lymphadenopathy:    She has no cervical adenopathy.  Neurological: She is alert and oriented to person, place, and time. No cranial nerve deficit.  No dysarthria, no aphasia  Skin: Skin is warm and dry.  No rash noted. No erythema.  Psychiatric: She has a normal mood and affect. Her behavior is normal. Judgment and thought content normal.    LABORATORY PANEL:   CBC Recent Labs  Lab 12/27/17 2028  WBC 9.5  HGB 13.2  HCT 38.7  PLT 278   ------------------------------------------------------------------------------------------------------------------  Chemistries  Recent Labs  Lab 12/27/17 2028  NA 139  K 4.0  CL 106  CO2 26  GLUCOSE 116*  BUN 18  CREATININE 0.84  CALCIUM 9.1   ------------------------------------------------------------------------------------------------------------------  Cardiac Enzymes No results for input(s): TROPONINI in the last 168 hours. ------------------------------------------------------------------------------------------------------------------  RADIOLOGY:  Dg Chest 1 View  Result Date: 12/27/2017 CLINICAL DATA:  Fall EXAM: CHEST  1 VIEW COMPARISON:  10/17/2017 FINDINGS: No acute opacity or pleural effusion. Cardiomediastinal silhouette within normal limits. Aortic atherosclerosis. No pneumothorax. Old right-sided rib fractures. IMPRESSION: No active disease. Electronically Signed   By: Jasmine Pang M.D.   On: 12/27/2017 21:14    Dg Hip Unilat W Or Wo Pelvis 2-3 Views Right  Result Date: 12/27/2017 CLINICAL DATA:  Fall EXAM: DG HIP (WITH OR WITHOUT PELVIS) 2-3V RIGHT COMPARISON:  Intraoperative fluoroscopy 10/17/2017 FINDINGS: There is a periprosthetic fracture along the medial aspects of the proximal femoral shaft, just superior to the transverse screw crossing the intramedullary femoral rod. Contour abnormality along the inferior aspect of the greater trochanter and medially displaced lesser trochanter fracture fragments may be a sequela the previous fracture and associated internal fixation. Comparison is somewhat limited to the intraoperative views. IMPRESSION: Periprosthetic fracture along the medial cortex of the proximal right femur. Displacement of the greater and lesser trochanter is favored to be related to the prior trauma. Electronically Signed   By: Deatra Robinson M.D.   On: 12/27/2017 21:16    EKG:   Orders placed or performed during the hospital encounter of 12/27/17  . EKG 12-Lead  . EKG 12-Lead  . ED EKG  . ED EKG    IMPRESSION AND PLAN:  Principal Problem:   Closed right hip fracture (HCC) - PRN analgesia, orthopedic surgery consult Active Problems:   Hypertension - continue home meds   HLD (hyperlipidemia) - home dose antilipid   Dementia with behavioral disturbance - continue home meds   GERD (gastroesophageal reflux disease) - home dose PPI   Hypothyroidism - home dose thyroid replacement  Chart review performed and case discussed with ED provider. Labs, imaging and/or ECG reviewed by provider and discussed with patient/family. Management plans discussed with the patient and/or family.  DVT PROPHYLAXIS: Mechanical only  GI PROPHYLAXIS: PPI  ADMISSION STATUS: Inpatient  CODE STATUS: Full Code Status History    Date Active Date Inactive Code Status Order ID Comments User Context   10/17/2017 0435 10/19/2017 1625 Full Code [id number]  Oralia Manis, MD Inpatient    Advance Directive  Documentation     Most Recent Value  Type of Advance Directive  Healthcare Power of Attorney, Living will  Pre-existing out of facility DNR order (yellow form or pink MOST form)  -  "MOST" Form in Place?  -      TOTAL TIME TAKING CARE OF THIS PATIENT: 45 minutes.   Anne Hahn, Woodie Trusty FIELDING 12/27/2017, 10:28 PM  [facility]  Office  [phone]  CC: Primary care physician; Patrice Paradise, MD  Note:  This document was prepared using Dragon voice recognition software and may include unintentional dictation errors.

## 2017-12-27 NOTE — ED Provider Notes (Signed)
[facility] Emergency Department Provider Note  Time seen: 8:28 PM  I have reviewed the triage vital signs and the nursing notes.   HISTORY  Chief Complaint Fall and Hip Injury (3 weeks post op right hip fracture )    HPI Joann [name] is a 84 y.o. female with a past medical history of anxiety, gastric reflux, hypertension, hyperlipidemia 3 weeks status post right hip surgery presents to the emergency department with right hip pain after fall.  According to the patient 3 weeks ago she had a fall breaking her right hip requiring a surgery.  Today unfortunately the patient had another fall landing on her right side.  States significant pain to the right hip unable to stand or bear weight since the fall.  Patient denies hitting her head or passing out.  Rates her pain as severe and sharp much worse with any movement in the leg.  Largely negative review of systems otherwise.   Past Medical History:  Diagnosis Date  . Anxiety   . Anxiety disorder   . Arthritis   . Chicken pox   . Colon polyp   . GERD (gastroesophageal reflux disease)   . Hyperlipidemia   . Hypertension   . Macular degeneration    Dr. Allyne Gee in [place]  . Thyroid disease     Patient Active Problem List   Diagnosis Date Noted  . Status post hip surgery 11/10/2017  . Protein-calorie malnutrition (HCC) 10/29/2017  . Closed right hip fracture (HCC) 10/17/2017  . GERD (gastroesophageal reflux disease) 10/17/2017  . Hypothyroidism 10/17/2017  . Dementia with behavioral disturbance 12/11/2014  . Rosacea 10/04/2014  . Hallucination, visual 10/04/2014  . B12 deficiency 10/04/2014  . Macular degeneration 07/10/2013  . Screening for breast cancer 05/23/2013  . Carpal tunnel syndrome 11/21/2012  . Medicare annual wellness visit, subsequent 07/18/2012  . Hypertension 04/06/2012  . HLD (hyperlipidemia) 04/06/2012    Past Surgical History:  Procedure Laterality Date  . APPENDECTOMY   1942  . INTRAMEDULLARY (IM) NAIL INTERTROCHANTERIC Right 10/17/2017   Procedure: INTRAMEDULLARY (IM) NAIL INTERTROCHANTRIC;  Surgeon: Deeann Saint, MD;  Location: [facility] ORS;  Service: Orthopedics;  Laterality: Right;  . KNEE DISLOCATION SURGERY Right 1996  . TOTAL ABDOMINAL HYSTERECTOMY W/ BILATERAL SALPINGOOPHORECTOMY  1971    Prior to Admission medications   Medication Sig Start Date End Date Taking? Authorizing Provider  acetaminophen (TYLENOL) 325 MG tablet Take 650 mg by mouth 4 (four) times daily.    [provider]  Amino Acids-Protein Hydrolys (FEEDING SUPPLEMENT, PRO-STAT SUGAR FREE 64,) LIQD Take 30 mLs by mouth 2 (two) times daily between meals. low protein, low albumin    [provider]  Cholecalciferol 4000 units CAPS Take 1 capsule by mouth daily.    [provider]  donepezil (ARICEPT) 10 MG tablet Take 10 mg by mouth at bedtime.  09/13/17   [provider]  folic acid (FOLVITE) 1 MG tablet Take 1 mg by mouth daily.    [provider]  levothyroxine (SYNTHROID, LEVOTHROID) 25 MCG tablet TAKE ONE TABLET BY MOUTH ONCE DAILY 01/02/16   Shelia Media, MD  Lidocaine (ASPERCREME LIDOCAINE) 4 % PTCH Apply 1 patch topically daily. Apply to right hip daily for pain. Remove after 12 hours    [provider]  mirtazapine (REMERON) 7.5 MG tablet Take 7.5 mg by mouth at bedtime.  09/13/17   [provider]  Nutritional Supplements (ENSURE ENLIVE PO) Take 1 Bottle by mouth 2 (two)  times daily between meals.    [provider]  Probiotic Product (RISA-BID PROBIOTIC) TABS Take 1 tablet by mouth 2 (two) times daily.    [provider]  QUEtiapine (SEROQUEL) 50 MG tablet Take 100 mg by mouth at bedtime. 2 tabs 09/14/17   [provider]  sertraline (ZOLOFT) 50 MG tablet TAKE ONE TABLET BY MOUTH ONCE DAILY *FOLLOW  UP  WITH  PCP  FOR  MORE  REFILLS* 06/04/16   Tommie Sams, DO  traMADol (ULTRAM) 50 MG tablet  Take 50 mg by mouth every 4 (four) hours as needed.    [provider]  traMADol (ULTRAM) 50 MG tablet Take 50 mg by mouth 2 (two) times daily. pain- scheduled. Hold for sedation     [provider]  zinc gluconate 50 MG tablet Take 50 mg by mouth daily.    [provider]    Allergies  Allergen Reactions  . Codeine Other (See Comments)    Paralysis     Family History  Problem Relation Age of Onset  . Heart disease Mother   . Hypertension Mother   . Parkinsonism Mother   . Heart disease Father   . Hypertension Father   . Cancer Father        bone  . Heart disease Brother     Social History Social History   Tobacco Use  . Smoking status: Current Every Day Smoker    Packs/day: 0.50    Types: Cigarettes  . Smokeless tobacco: Never Used  Substance Use Topics  . Alcohol use: Yes    Comment: rarely  . Drug use: No    Review of Systems Constitutional: Negative for loss of consciousness. Eyes: Negative for visual complaints ENT: Negative for recent illness/congestion Cardiovascular: Negative for chest pain. Respiratory: Negative for shortness of breath. Gastrointestinal: Negative for abdominal pain, vomiting Genitourinary: Negative for urinary compaints Musculoskeletal: Right hip pain Skin: Negative for skin complaints  Neurological: Negative for headache All other ROS negative  ____________________________________________   PHYSICAL EXAM:  VITAL SIGNS: ED Triage Vitals  Enc Vitals Group     BP 12/27/17 2021 104/80     Pulse Rate 12/27/17 2021 78     Resp 12/27/17 2021 15     Temp 12/27/17 2021 97.6 F (36.4 C)     Temp Source 12/27/17 2021 Oral     SpO2 12/27/17 2021 99 %     Weight 12/27/17 2023 130 lb (59 kg)     Height --      Head Circumference --      Peak Flow --      Pain Score 12/27/17 2021 10     Pain Loc --      Pain Edu? --      Excl. in GC? --    Constitutional: Alert and oriented. Well appearing and in no  distress. Eyes: Normal exam ENT   Head: Normocephalic and atraumatic   Mouth/Throat: Mucous membranes are moist. Cardiovascular: Normal rate, regular rhythm. No murmur Respiratory: Normal respiratory effort without tachypnea nor retractions. Breath sounds are clear  Gastrointestinal: Soft and nontender. No distention.  Musculoskeletal: Significant right hip pain.  Significant tenderness to palpation.  Significant pain with attempted range of motion.  1+ DP pulse. Neurologic:  Normal speech and language. No gross focal neurologic deficits Skin:  Skin is warm, dry and intact.  Psychiatric: Mood and affect are normal.  ____________________________________________    EKG  EKG reviewed and interpreted by myself  shows normal sinus rhythm at 61 bpm with a narrow QRS, normal axis, no concerning ST changes.  ____________________________________________    RADIOLOGY  X-ray consistent with new fracture around her hardware.  Test x-ray negative.  ____________________________________________   INITIAL IMPRESSION / ASSESSMENT AND PLAN / ED COURSE  Pertinent labs & imaging results that were available during my care of the patient were reviewed by me and considered in my medical decision making (see chart for details).  Patient presents to the emergency department 3 weeks after right hip surgery after a fall with right hip pain.  Differential would include contusion, fracture, dislocation.  Will obtain x-ray imaging, treat pain, check basic labs as well as a chest x-ray for likely preop.  Patient agreeable to plan of care.  X-ray shows a new fracture around the hardware.  I reviewed the patient's records she appears to have a right hip surgery performed 2 months ago not 3 weeks ago as previously reported.  I discussed the patient with Dr. Odis Luster of orthopedic surgery, Dr. Hyacinth Meeker perform the prior surgery.  Patient will be admitted to the hospitalist service for continued management pain  management, Dr. Odis Luster has requested a CT scan be performed for possible surgical evaluation.  Basic labs are largely nonrevealing.  ____________________________________________   FINAL CLINICAL IMPRESSION(S) / ED DIAGNOSES  Right hip pain Right hip fracture   Minna Antis, MD 12/27/17 2129

## 2017-12-27 NOTE — ED Notes (Signed)
Family at bedside. 

## 2017-12-27 NOTE — ED Triage Notes (Signed)
Patient coming into night for fall trying to get up to bedside commode. Patient lives at home and is taken care of family and hospice. Patient is 3 weeks post op for right hip fracture surgery. Patient was given fentanyl by EMS. 18 gauge established by EMS.

## 2017-12-28 LAB — URINALYSIS, COMPLETE (UACMP) WITH MICROSCOPIC
Bilirubin Urine: NEGATIVE
GLUCOSE, UA: NEGATIVE mg/dL
Ketones, ur: NEGATIVE mg/dL
NITRITE: POSITIVE — AB
PH: 5 (ref 5.0–8.0)
PROTEIN: 30 mg/dL — AB
Specific Gravity, Urine: 1.017 (ref 1.005–1.030)
WBC, UA: >50 WBC/hpf — ABNORMAL HIGH (ref 0–5)

## 2017-12-28 LAB — PROTIME-INR
INR: 0.95
Prothrombin Time: 12.6 seconds (ref 11.4–15.2)

## 2017-12-28 LAB — CBC
HEMATOCRIT: 37.2 % (ref 35.0–47.0)
Hemoglobin: 12.7 g/dL (ref 12.0–16.0)
MCH: 31.5 pg (ref 26.0–34.0)
MCHC: 34.1 g/dL (ref 32.0–36.0)
MCV: 92.4 fL (ref 80.0–100.0)
Platelets: 257 10*3/uL (ref 150–440)
RBC: 4.03 MIL/uL (ref 3.80–5.20)
RDW: 16.7 % — ABNORMAL HIGH (ref 11.5–14.5)
WBC: 10.9 10*3/uL (ref 3.6–11.0)

## 2017-12-28 LAB — SURGICAL PCR SCREEN
MRSA, PCR: NEGATIVE
Staphylococcus aureus: NEGATIVE

## 2017-12-28 LAB — BASIC METABOLIC PANEL
ANION GAP: 5 (ref 5–15)
BUN: 17 mg/dL (ref 6–20)
CHLORIDE: 108 mmol/L (ref 101–111)
CO2: 25 mmol/L (ref 22–32)
Calcium: 9.1 mg/dL (ref 8.9–10.3)
Creatinine, Ser: 0.7 mg/dL (ref 0.44–1.00)
GFR calc non Af Amer: >60 mL/min (ref >60)
Glucose, Bld: 130 mg/dL — ABNORMAL HIGH (ref 65–99)
POTASSIUM: 4.5 mmol/L (ref 3.5–5.1)
SODIUM: 138 mmol/L (ref 135–145)

## 2017-12-28 LAB — APTT: APTT: 28 s (ref 24–36)

## 2017-12-28 MED ORDER — HYDROMORPHONE HCL 1 MG/ML IJ SOLN
0.5000 mg | INTRAMUSCULAR | Status: DC | PRN
  Administered 2017-12-28 – 2017-12-29 (×7): 0.5 mg via INTRAVENOUS
  Filled 2017-12-28 (×8): qty 1

## 2017-12-28 MED ORDER — DIAZEPAM 5 MG PO TABS
5.0000 mg | ORAL_TABLET | Freq: Four times a day (QID) | ORAL | Status: DC | PRN
  Administered 2017-12-28: 5 mg via ORAL
  Filled 2017-12-28: qty 1

## 2017-12-28 MED ORDER — METHOCARBAMOL 500 MG PO TABS
500.0000 mg | ORAL_TABLET | Freq: Four times a day (QID) | ORAL | Status: DC | PRN
  Administered 2017-12-28 – 2017-12-29 (×3): 500 mg via ORAL
  Filled 2017-12-28 (×4): qty 1

## 2017-12-28 NOTE — Care Management Note (Signed)
Case Management Note  Patient Details  Name: LADAVIA LINDENBAUM MRN: [id number] Date of Birth: 1932/09/21  Subjective/Objective:                  RNCM met with patient and a hired caregiver to assess for transition of care.  She stays with her family (?son/DIL) per caregiver and caregiver stays with patient 9A-230P, and then 4P until family returns.  Patient has hospital bed, walker, bedside commode, jazzy chair and bed alarm.  Patient is alone some part of the day.  She is followed by [facility] in the home.    Action/Plan:  RNCM will follow.   Expected Discharge Date:  12/30/17               Expected Discharge Plan:     In-House Referral:     Discharge planning Services  CM Consult  Post Acute Care Choice:  Hospice Choice offered to:  Patient  DME Arranged:    DME Agency:     HH Arranged:    [place] Agency:  [facility]  Status of Service:  In process, will continue to follow  If discussed at Long Length of Stay Meetings, dates discussed:    Additional Comments:  Marshell Garfinkel, RN 12/28/2017, 10:50 AM

## 2017-12-28 NOTE — NC FL2 (Signed)
[place] MEDICAID FL2 LEVEL OF CARE SCREENING TOOL     IDENTIFICATION  Patient Name: Joann [name] Birthdate: 1933-02-22 Sex: female Admission Date (Current Location): 12/27/2017  [place] and [place] Number:  Chiropodist and Address:  [facility], [address] [place], [place] [address]      Provider Number: [id number]  Attending Physician Name and Address:  Enedina Finner, MD  Relative Name and Phone Number:       Current Level of Care: Hospital Recommended Level of Care: Skilled Nursing Facility Prior Approval Number:    Date Approved/Denied:   PASRR Number: [id number] )  Discharge Plan: SNF    Current Diagnoses: Patient Active Problem List   Diagnosis Date Noted  . Status post hip surgery 11/10/2017  . Protein-calorie malnutrition (HCC) 10/29/2017  . Closed right hip fracture (HCC) 10/17/2017  . GERD (gastroesophageal reflux disease) 10/17/2017  . Hypothyroidism 10/17/2017  . Dementia with behavioral disturbance 12/11/2014  . Rosacea 10/04/2014  . Hallucination, visual 10/04/2014  . B12 deficiency 10/04/2014  . Macular degeneration 07/10/2013  . Screening for breast cancer 05/23/2013  . Carpal tunnel syndrome 11/21/2012  . Medicare annual wellness visit, subsequent 07/18/2012  . Hypertension 04/06/2012  . HLD (hyperlipidemia) 04/06/2012    Orientation RESPIRATION BLADDER Height & Weight     Self, Place  Normal Incontinent Weight: 135 lb (61.2 kg) Height:   (154.9 cm)  BEHAVIORAL SYMPTOMS/MOOD NEUROLOGICAL BOWEL NUTRITION STATUS      Continent Diet(Diet: NPO for surgery to be advanced. )  AMBULATORY STATUS COMMUNICATION OF NEEDS Skin   Extensive Assist Verbally Surgical wounds                       Personal Care Assistance Level of Assistance  Bathing, Feeding, Dressing Bathing Assistance: Limited assistance Feeding assistance: Independent Dressing Assistance: Limited assistance     Functional  Limitations Info  Sight, Hearing, Speech Sight Info: Adequate Hearing Info: Adequate Speech Info: Adequate    SPECIAL CARE FACTORS FREQUENCY  PT (By licensed PT), OT (By licensed OT)     PT Frequency: (5) OT Frequency: (5)            Contractures      Additional Factors Info  Code Status, Allergies Code Status Info: (DNR ) Allergies Info: (Codeine)           Current Medications (12/28/2017):  This is the current hospital active medication list Current Facility-Administered Medications  Medication Dose Route Frequency Provider Last Rate Last Dose  . acetaminophen (TYLENOL) tablet 650 mg  650 mg Oral Q6H PRN Oralia Manis, MD       Or  . acetaminophen (TYLENOL) suppository 650 mg  650 mg Rectal Q6H PRN Oralia Manis, MD      . donepezil (ARICEPT) tablet 10 mg  10 mg Oral Lamont Snowball, MD      . HYDROmorphone (DILAUDID) injection 0.5 mg  0.5 mg Intravenous Q3H PRN Oralia Manis, MD   0.5 mg at 12/28/17 0816  . levothyroxine (SYNTHROID, LEVOTHROID) tablet 25 mcg  25 mcg Oral QAC breakfast Oralia Manis, MD   25 mcg at 12/28/17 [phone]  . methocarbamol (ROBAXIN) tablet 500 mg  500 mg Oral Q6H PRN Enedina Finner, MD   500 mg at 12/28/17 1117  . mirtazapine (REMERON) tablet 7.5 mg  7.5 mg Oral QHS Oralia Manis, MD      . ondansetron [facility]) tablet 4 mg  4 mg Oral Q6H PRN  Oralia Manis, MD       Or  . ondansetron [facility]) injection 4 mg  4 mg Intravenous Q6H PRN Oralia Manis, MD      . oxyCODONE (Oxy IR/ROXICODONE) immediate release tablet 5 mg  5 mg Oral Q4H PRN Oralia Manis, MD   5 mg at 12/28/17 1013  . QUEtiapine (SEROQUEL) tablet 100 mg  100 mg Oral Lamont Snowball, MD   100 mg at 12/27/17 2356  . sertraline (ZOLOFT) tablet 50 mg  50 mg Oral Daily Oralia Manis, MD   50 mg at 12/28/17 1610     Discharge Medications: Please see discharge summary for a list of discharge medications.  Relevant Imaging Results:  Relevant Lab Results:   Additional  Information (SSN: [id number])  Matt Delpizzo, Darleen Crocker, LCSW

## 2017-12-28 NOTE — Consult Note (Signed)
ORTHOPAEDIC CONSULTATION  REQUESTING PHYSICIAN: Enedina Finner, MD  Chief Complaint: hip pain   HPI: Joann [name] is a 84 y.o. female who complains of  Right hip pain. History is obtained from family in the room. Please see H&P and ED notes for details.  Past Medical History:  Diagnosis Date  . Anxiety   . Anxiety disorder   . Arthritis   . Chicken pox   . Colon polyp   . GERD (gastroesophageal reflux disease)   . Hyperlipidemia   . Hypertension   . Macular degeneration    Dr. Allyne Gee in [place]  . Thyroid disease    Past Surgical History:  Procedure Laterality Date  . APPENDECTOMY  1942  . INTRAMEDULLARY (IM) NAIL INTERTROCHANTERIC Right 10/17/2017   Procedure: INTRAMEDULLARY (IM) NAIL INTERTROCHANTRIC;  Surgeon: Deeann Saint, MD;  Location: [facility] ORS;  Service: Orthopedics;  Laterality: Right;  . KNEE DISLOCATION SURGERY Right 1996  . TOTAL ABDOMINAL HYSTERECTOMY W/ BILATERAL SALPINGOOPHORECTOMY  1971   Social History   Socioeconomic History  . Marital status: Widowed    Spouse name: Not on file  . Number of children: Not on file  . Years of education: 56  . Highest education level: High school graduate  Occupational History  . Not on file  Social Needs  . Financial resource strain: Not on file  . Food insecurity:    Worry: Not on file    Inability: Not on file  . Transportation needs:    Medical: Not on file    Non-medical: Not on file  Tobacco Use  . Smoking status: Current Every Day Smoker    Packs/day: 0.50    Types: Cigarettes  . Smokeless tobacco: Never Used  Substance and Sexual Activity  . Alcohol use: Yes    Comment: rarely  . Drug use: No  . Sexual activity: Not Currently  Lifestyle  . Physical activity:    Days per week: Not on file    Minutes per session: Not on file  . Stress: Not on file  Relationships  . Social connections:    Talks on phone: Not on file    Gets together: Not on file    Attends religious service: Not on file     Active member of club or organization: Not on file    Attends meetings of clubs or organizations: Not on file    Relationship status: Not on file  Other Topics Concern  . Not on file  Social History Narrative   Lives in [place].      Retired - Worked as Water quality scientist   Family History  Problem Relation Age of Onset  . Heart disease Mother   . Hypertension Mother   . Parkinsonism Mother   . Heart disease Father   . Hypertension Father   . Cancer Father        bone  . Heart disease Brother    Allergies  Allergen Reactions  . Codeine Other (See Comments)    Paralysis    Prior to Admission medications   Medication Sig Start Date End Date Taking? Authorizing Provider  acetaminophen (TYLENOL) 325 MG tablet Take 650 mg by mouth 4 (four) times daily.   Yes [provider]  Cholecalciferol 4000 units CAPS Take 1 capsule by mouth daily.   Yes [provider]  donepezil (ARICEPT) 10 MG tablet Take 10 mg by mouth at bedtime.  09/13/17  Yes [provider]  folic acid (FOLVITE) 1 MG tablet Take 1 mg  by mouth daily.   Yes [provider]  levothyroxine (SYNTHROID, LEVOTHROID) 25 MCG tablet TAKE ONE TABLET BY MOUTH ONCE DAILY 01/02/16  Yes Shelia Media, MD  Lidocaine (ASPERCREME LIDOCAINE) 4 % PTCH Apply 1 patch topically daily. Apply to right hip daily for pain. Remove after 12 hours   Yes [provider]  mirtazapine (REMERON) 7.5 MG tablet Take 7.5 mg by mouth at bedtime.  09/13/17  Yes [provider]  Probiotic Product (RISA-BID PROBIOTIC) TABS Take 1 tablet by mouth 2 (two) times daily.   Yes [provider]  QUEtiapine (SEROQUEL) 50 MG tablet Take 100 mg by mouth at bedtime. 2 tabs 09/14/17  Yes [provider]  sertraline (ZOLOFT) 50 MG tablet TAKE ONE TABLET BY MOUTH ONCE DAILY *FOLLOW  UP  WITH  PCP  FOR  MORE  REFILLS* 06/04/16  Yes Cook, Jayce G, DO  traMADol (ULTRAM) 50 MG tablet Take 50 mg by mouth 2  (two) times daily. pain- scheduled. Hold for sedation    Yes [provider]  zinc gluconate 50 MG tablet Take 50 mg by mouth daily.   Yes [provider]  Amino Acids-Protein Hydrolys (FEEDING SUPPLEMENT, PRO-STAT SUGAR FREE 64,) LIQD Take 30 mLs by mouth 2 (two) times daily between meals. low protein, low albumin    [provider]  Nutritional Supplements (ENSURE ENLIVE PO) Take 1 Bottle by mouth 2 (two) times daily between meals.    [provider]   Dg Chest 1 View  Result Date: 12/27/2017 CLINICAL DATA:  Fall EXAM: CHEST  1 VIEW COMPARISON:  10/17/2017 FINDINGS: No acute opacity or pleural effusion. Cardiomediastinal silhouette within normal limits. Aortic atherosclerosis. No pneumothorax. Old right-sided rib fractures. IMPRESSION: No active disease. Electronically Signed   By: Jasmine Pang M.D.   On: 12/27/2017 21:14   Ct Hip Right Wo Contrast  Result Date: 12/27/2017 CLINICAL DATA:  Right hip pain after fall. History right femoral nail fixation 3 weeks ago for fracture. EXAM: CT OF THE RIGHT HIP WITHOUT CONTRAST TECHNIQUE: Multidetector CT imaging of the right hip was performed according to the standard protocol. Multiplanar CT image reconstructions were also generated. COMPARISON:  Preoperative study from 10/17/2017 of the right hip, intraoperative study from 10/17/2017 and radiographs of the right hip from 12/27/2017. FINDINGS: Bones/Joint/Cartilage New periprosthetic, comminuted spiral appearing fracture of the proximal right femur is noted starting from the tail of the nail blade and terminating adjacent to the tip of the uncemented femoral stem, series 5/34 through 41. Approximately 10 degrees of dorsal angulation of the femoral shaft is identified, series 5/36 due to the apex volar fracture. Subacute pre-existing fractures of the greater and lesser trochanters with some interval callus formation about the greater trochanter is noted. The lesser trochanter  remains avulsed without callus. The distal locking screw of the femoral nail does not appear to be engaged within the nail and is seen posteromedial to the femoral component, series 2/83. Ligaments Suboptimally assessed by CT. Muscles and Tendons No intramuscular hemorrhage or atrophy. Soft tissues Postop scarring along the lateral aspect of the right hip. No significant joint effusion. IMPRESSION: 1. New slightly comminuted apex anterior fracture of the proximal right femoral diaphysis encircling the uncemented femoral component of a pre-existing intramedullary nail fixation. The distal interlocking screw of the nail appears to have disengaged from its aperture. 2. Subacute pre-existing greater and lesser trochanteric fractures of the femur. Electronically Signed   By: Tollie Eth M.D.   On:  12/27/2017 22:37   Dg Hip Unilat W Or Wo Pelvis 2-3 Views Right  Result Date: 12/27/2017 CLINICAL DATA:  Fall EXAM: DG HIP (WITH OR WITHOUT PELVIS) 2-3V RIGHT COMPARISON:  Intraoperative fluoroscopy 10/17/2017 FINDINGS: There is a periprosthetic fracture along the medial aspects of the proximal femoral shaft, just superior to the transverse screw crossing the intramedullary femoral rod. Contour abnormality along the inferior aspect of the greater trochanter and medially displaced lesser trochanter fracture fragments may be a sequela the previous fracture and associated internal fixation. Comparison is somewhat limited to the intraoperative views. IMPRESSION: Periprosthetic fracture along the medial cortex of the proximal right femur. Displacement of the greater and lesser trochanter is favored to be related to the prior trauma. Electronically Signed   By: Deatra Robinson M.D.   On: 12/27/2017 21:16    Positive ROS: All other systems have been reviewed and were otherwise negative with the exception of those mentioned in the HPI and as above.  Physical Exam: General: Alert, no acute distress Cardiovascular: No pedal  edema Respiratory: No cyanosis, no use of accessory musculature GI: No organomegaly, abdomen is soft and non-tender Skin: No lesions in the area of chief complaint Neurologic: Sensation intact distally Psychiatric: Patient is competent for consent with normal mood and affect Lymphatic: No axillary or cervical lymphadenopathy  MUSCULOSKELETAL: UEA:VWUJ and non-tender, RLE, short, externally rotated, pain with any movement, DF/PF/EHL intact, good cap refill  Assessment: Spiral fracture after short trochanteric femoral nail placement   Plan:  The diagnosis, risks, benefits and alternatives to treatment are all discussed in detail with the patient and family. Fortunately, the fracture is between the helical blade and distal screw. Recommend non-weight bearing on the right and non-operative management. The family is in agreement and she may return to hospice care tomorrow if her pain is reasonably controlled on oral medications and muscle relaxants. Please call with questions.    Lyndle Herrlich, MD    12/28/2017 12:27 PM

## 2017-12-28 NOTE — Progress Notes (Signed)
[facility] - [place] at [facility]   PATIENT NAME: Joann [name]    MR#:  [id number]  DATE OF BIRTH:  11-09-1932  SUBJECTIVE:  patient came in after mechanical fall at home. She is followed by hospice. She is currently quite confused. She has underlying dementia No family in the room.  REVIEW OF SYSTEMS:   Review of Systems  Unable to perform ROS: Dementia      DRUG ALLERGIES:   Allergies  Allergen Reactions  . Codeine Other (See Comments)    Paralysis     VITALS:  Blood pressure (!) 133/57, pulse 84, temperature 99.6 F (37.6 C), temperature source Oral, resp. rate 18, height  (1.549 m), weight 61.2 kg (135 lb), SpO2 90 %.  PHYSICAL EXAMINATION:   Physical Exam  GENERAL:  84 y.o.-year-old patient lying in the bed with no acute distress. Confused EYES: Pupils equal, round, reactive to light and accommodation. HEENT: Head atraumatic, normocephalic. Oropharynx and nasopharynx clear.  NECK:  Supple, no jugular venous distention. No thyroid enlargement, no tenderness.  LUNGS: Normal breath sounds bilaterally, no wheezing, rales, rhonchi. No use of accessory muscles of respiration.  CARDIOVASCULAR: S1, S2 normal. No murmurs, rubs, or gallops.  ABDOMEN: Soft, nontender, nondistended. Bowel sounds present. No organomegaly or mass.  EXTREMITIES: No cyanosis, clubbing or edema b/l.    NEUROLOGIC: unable to test secondary to confusion PSYCHIATRIC:  patient is alert has dementia SKIN: No obvious rash, lesion, or ulcer.   LABORATORY PANEL:  CBC Recent Labs  Lab 12/28/17 0455  WBC 10.9  HGB 12.7  HCT 37.2  PLT 257    Chemistries  Recent Labs  Lab 12/28/17 0455  NA 138  K 4.5  CL 108  CO2 25  GLUCOSE 130*  BUN 17  CREATININE 0.70  CALCIUM 9.1   Cardiac Enzymes No results for input(s): TROPONINI in the last 168 hours. RADIOLOGY:  Dg Chest 1 View  Result Date: 12/27/2017 CLINICAL DATA:  Fall EXAM: CHEST  1 VIEW COMPARISON:   10/17/2017 FINDINGS: No acute opacity or pleural effusion. Cardiomediastinal silhouette within normal limits. Aortic atherosclerosis. No pneumothorax. Old right-sided rib fractures. IMPRESSION: No active disease. Electronically Signed   By: Jasmine Pang M.D.   On: 12/27/2017 21:14   Ct Hip Right Wo Contrast  Result Date: 12/27/2017 CLINICAL DATA:  Right hip pain after fall. History right femoral nail fixation 3 weeks ago for fracture. EXAM: CT OF THE RIGHT HIP WITHOUT CONTRAST TECHNIQUE: Multidetector CT imaging of the right hip was performed according to the standard protocol. Multiplanar CT image reconstructions were also generated. COMPARISON:  Preoperative study from 10/17/2017 of the right hip, intraoperative study from 10/17/2017 and radiographs of the right hip from 12/27/2017. FINDINGS: Bones/Joint/Cartilage New periprosthetic, comminuted spiral appearing fracture of the proximal right femur is noted starting from the tail of the nail blade and terminating adjacent to the tip of the uncemented femoral stem, series 5/34 through 41. Approximately 10 degrees of dorsal angulation of the femoral shaft is identified, series 5/36 due to the apex volar fracture. Subacute pre-existing fractures of the greater and lesser trochanters with some interval callus formation about the greater trochanter is noted. The lesser trochanter remains avulsed without callus. The distal locking screw of the femoral nail does not appear to be engaged within the nail and is seen posteromedial to the femoral component, series 2/83. Ligaments Suboptimally assessed by CT. Muscles and Tendons No intramuscular hemorrhage or atrophy. Soft tissues Postop scarring along the  lateral aspect of the right hip. No significant joint effusion. IMPRESSION: 1. New slightly comminuted apex anterior fracture of the proximal right femoral diaphysis encircling the uncemented femoral component of a pre-existing intramedullary nail fixation. The distal  interlocking screw of the nail appears to have disengaged from its aperture. 2. Subacute pre-existing greater and lesser trochanteric fractures of the femur. Electronically Signed   By: Tollie Eth M.D.   On: 12/27/2017 22:37   Dg Hip Unilat W Or Wo Pelvis 2-3 Views Right  Result Date: 12/27/2017 CLINICAL DATA:  Fall EXAM: DG HIP (WITH OR WITHOUT PELVIS) 2-3V RIGHT COMPARISON:  Intraoperative fluoroscopy 10/17/2017 FINDINGS: There is a periprosthetic fracture along the medial aspects of the proximal femoral shaft, just superior to the transverse screw crossing the intramedullary femoral rod. Contour abnormality along the inferior aspect of the greater trochanter and medially displaced lesser trochanter fracture fragments may be a sequela the previous fracture and associated internal fixation. Comparison is somewhat limited to the intraoperative views. IMPRESSION: Periprosthetic fracture along the medial cortex of the proximal right femur. Displacement of the greater and lesser trochanter is favored to be related to the prior trauma. Electronically Signed   By: Deatra Robinson M.D.   On: 12/27/2017 21:16   ASSESSMENT AND PLAN:  Joann [name]  is a 84 y.o. female who presents with fall and right hip pain.  Patient had right hip fracture repaired surgically several weeks ago.  She fell again today and imaging shows peri-prosthetic fracture  1.Closed right hip fracture (HCC) peri-prosthetic fracture due to mechanical fall  - PRN analgesia, orthopedic surgery consult with dr Odis Luster. Non operative management. -She is followed with hospice at home. Poor social worker patient will most likely discharged home with hospice to follow  2.  Hypertension - continue home meds  3.  HLD (hyperlipidemia) - home dose antilipid  4.  Dementia with behavioral disturbance - continue home meds  5.  GERD (gastroesophageal reflux disease) - home dose PPI    6. Hypothyroidism - home dose thyroid replacement  Discharge  planning most likely home with hospice.   Case discussed with Care Management/Social Worker. Management plans discussed with the patient, family and they are in agreement.  CODE STATUS: DNR  DVT Prophylaxis: lovenox  TOTAL TIME TAKING CARE OF THIS PATIENT: *30* minutes.  >50% time spent on counselling and coordination of care  POSSIBLE D/C IN 1-2 DAYS, DEPENDING ON CLINICAL CONDITION.  Note: This dictation was prepared with Dragon dictation along with smaller phrase technology. Any transcriptional errors that result from this process are unintentional.  Enedina Finner M.D on 12/28/2017 at 3:38 PM  Between 7am to 6pm - Pager - [phone]  After 6pm go to [email or web address] - Social research officer, government  [facility]  Office  [phone]  CC: Primary care physician; Patrice Paradise, MDPatient ID: Joann [name], female   DOB: Jan 14, 1933, 84 y.o.   MRN: [id number]

## 2017-12-28 NOTE — Progress Notes (Signed)
Visit made. Patient is currently followed by [facility] at home with a hospice diagnosis of abnormal weight loss. She is a DNR code, with an out of facility DNR in place in her home. She was sent to the [facility] ED last night following a fall with complaints of right hip pain. IN the ED xray revealed a right hip fracture, CT was also performed, confirming location and type of fracture.. Patient seen lying in bed alert, care giver Evette Cristal and daughter [name] at bedside. Patient greeted Clinical research associate and interacted through out the visit. She did complain of hip pain and was resistant to any movement in the bed or movement of either of her legs. Dr. Odis Luster in during visit and discussed type and placement of patient's fracture. No surgical intervention is needed. Patient will be non weight bearing. Discussed pain management in detail as well as foley catheter placement with Charlies Constable and staff RN Mai. Patient will most likely discharge home tomorrow. She will need EMS transport. Will continue to follow and update hospice team. CSW [facility] Collie Siad aware that patient is followed by hospice. Dayna Barker RN, BSN, [facility] [place], hospital Liaison [phone]

## 2017-12-28 NOTE — Progress Notes (Signed)
Spoke with Dr. Odis Luster about patient's increased pain with muscle spasms that are not being relieved with Robaxin. Order received for 5 pounds of buck's traction and diazepam  orally every 6 hrs prn.

## 2017-12-28 NOTE — Clinical Social Work Note (Signed)
Clinical Social Work Assessment  Patient Details  Name: Joann [name] MRN: [id number] Date of Birth: 1933-04-27  Date of referral:  12/28/17               Reason for consult:  Discharge Planning                Permission sought to share information with:  Case Manager Permission granted to share information::  Yes, Verbal Permission Granted  Name::        Agency::     Relationship::     Contact Information:     Housing/Transportation Living arrangements for the past 2 months:  Single Family Home Source of Information:  Patient, Adult Children Patient Interpreter Needed:  None Criminal Activity/Legal Involvement Pertinent to Current Situation/Hospitalization:  No - Comment as needed Significant Relationships:  Adult Children Lives with:  Adult Children Do you feel safe going back to the place where you live?  Yes Need for family participation in patient care:  Yes (Comment)  Care giving concerns:  Patient lives in [place] with her daughter Joann [name] and is currently followed by [facility] in the home.    Social Worker assessment / plan:  Holiday representative ([place]) reviewed chart and noted that patient has a hip fracture. Patient was recently at [facility] for short term rehab due to a previous hip fracture. Patient discharged home from [facility] with [facility]. Ortho consult is pending. CSW met with patient and her daughter Joann [name] and caregiver Joann [name] were at bedside. Per daughter she hired Joann [name] to stay with patient during the day while she is at work. Per Joann [name] she stays with patient at night. CSW explained that patient would have to stop hospice services in order to go to short term rehab again at a SNF. Daughter reported that she prefers to take patient home and continue hospice care. Per daughter patient has all the equipment she needs and she understands that patient will have a difficult time getting out of bed due to the hip fracture. RN case manager and [facility] liaison are aware of above. CSW will continue to follow and assist as needed.   Employment status:  Disabled (Comment on whether or not currently receiving Disability), Retired Nurse, adult PT Recommendations:  Not assessed at this time Information / Referral to community resources:  Other (Comment Required)(Patient's daughter Joann [name] prefers to take patient home and continue hospice care. )  Patient/Family's Response to care:  Patient's daughter prefers to take patient home.   Patient/Family's Understanding of and Emotional Response to Diagnosis, Current Treatment, and Prognosis:  Patient's daughter was very pleasant and thanked CSW for visit.   Emotional Assessment Appearance:  Appears stated age Attitude/Demeanor/Rapport:    Affect (typically observed):  Pleasant Orientation:  Oriented to Self, Oriented to Place, Fluctuating Orientation (Suspected and/or reported Sundowners) Alcohol / Substance use:  Not Applicable Psych involvement (Current and /or in the community):  No (Comment)  Discharge Needs  Concerns to be addressed:  Discharge Planning Concerns Readmission within the last 30 days:  No Current discharge risk:  Dependent with Mobility, Chronically ill Barriers to Discharge:  Continued Medical Work up   [employer], Veronia Beets, LCSW 12/28/2017, 11:25 AM

## 2017-12-29 MED ORDER — OXYCODONE HCL 5 MG PO TABS: 5.0000 mg | ORAL_TABLET | ORAL | 0 refills | Status: AC | PRN

## 2017-12-29 MED ORDER — CEPHALEXIN 500 MG PO CAPS
500.0000 mg | ORAL_CAPSULE | Freq: Two times a day (BID) | ORAL | Status: DC
  Administered 2017-12-29: 500 mg via ORAL
  Filled 2017-12-29: qty 1

## 2017-12-29 MED ORDER — METHOCARBAMOL 500 MG PO TABS: 500.0000 mg | ORAL_TABLET | Freq: Four times a day (QID) | ORAL | 0 refills | Status: AC | PRN

## 2017-12-29 MED ORDER — CEPHALEXIN 500 MG PO CAPS
500.0000 mg | ORAL_CAPSULE | Freq: Two times a day (BID) | ORAL | 0 refills | Status: DC
Start: 2017-12-29 — End: 2019-02-23

## 2017-12-29 MED ORDER — DIAZEPAM 2 MG PO TABS: 2.0000 mg | ORAL_TABLET | Freq: Three times a day (TID) | ORAL | 0 refills | Status: AC | PRN

## 2017-12-29 NOTE — Progress Notes (Signed)
Visit made. Patient seen sitting up in bed, eating breakfast. Pain in more control this morning. She does still guard her left leg dn call out with any movement. Plan is for discharge home today. Discussed with patient's daughter [name] present. Patient will discharge home with new prescriptions for Oxycodone, robaxin, valium and Keflex. Foley catheter placed last evening. UA positive for urinary tract infection per staff RN Victorino Dike. Hospice triage notified of new prescriptions. Hospice team notified of pending discharge, foley and new prescriptions. Patient will require EMS transport. Signed DNR in place in discharge packet. Dayna Barker RN, BSN, [facility] [place], hospital liaison [phone]

## 2017-12-29 NOTE — Discharge Summary (Addendum)
[facility] - [place] [facility]   PATIENT NAME: Joann [name]    MR#:  [id number]  DATE OF BIRTH:  08-16-1932  DATE OF ADMISSION:  12/27/2017 ADMITTING PHYSICIAN: Oralia Manis, MD  DATE OF DISCHARGE: PRIMARY CARE PHYSICIAN: Patrice Paradise, MD    ADMISSION DIAGNOSIS:  Closed fracture of right hip, initial encounter (HCC) [S72.001A]  DISCHARGE DIAGNOSIS:  Spiral fracture after short trochanteric right femoral nail placement --nonoperative management  SECONDARY DIAGNOSIS:   Past Medical History:  Diagnosis Date  . Anxiety   . Anxiety disorder   . Arthritis   . Chicken pox   . Colon polyp   . GERD (gastroesophageal reflux disease)   . Hyperlipidemia   . Hypertension   . Macular degeneration    Dr. Allyne Gee in [place]  . Thyroid disease     HOSPITAL COURSE:   DoloresFoglemanis a84 y.o.femalewho presents with fall and right hip pain. Patient had right hip fracture repaired surgically several weeks ago. She fell again today and imaging shows peri-prosthetic fracture  1.Closed right hip fracture spiral (HCC) peri-prosthetic fracture due to mechanical fall  - PRN analgesia, orthopedic surgery consult with dr Odis Luster--- Non operative management. -She is followed with hospice at home. Poor social worker patient will discharged home with hospice to follow -prn diazepam, oxycodone, robaxin  2.Hypertension - continue home meds  3.HLD (hyperlipidemia) - home dose antilipid  4.Dementia with behavioral disturbance - continue home meds  5.GERD (gastroesophageal reflux disease) - home dose PPI  6. Hypothyroidism - home dose thyroid replacement  Discharge planning  home with hospice.    CONSULTS OBTAINED:  Treatment Team:  Lyndle Herrlich, MD  DRUG ALLERGIES:   Allergies  Allergen Reactions  . Codeine Other (See Comments)    Paralysis     DISCHARGE MEDICATIONS:   Allergies as of 12/29/2017      Reactions    Codeine Other (See Comments)   Paralysis       Medication List    TAKE these medications   acetaminophen 325 MG tablet Commonly known as:  TYLENOL Take 650 mg by mouth 4 (four) times daily.   ASPERCREME LIDOCAINE 4 % Ptch Generic drug:  Lidocaine Apply 1 patch topically daily. Apply to right hip daily for pain. Remove after 12 hours   cephALEXin 500 MG capsule Commonly known as:  KEFLEX Take 1 capsule (500 mg total) by mouth every 12 (twelve) hours.   Cholecalciferol 4000 units Caps Take 1 capsule by mouth daily.   diazepam 2 MG tablet Commonly known as:  VALIUM Take 1 tablet (2 mg total) by mouth every 8 (eight) hours as needed for muscle spasms (unrelieved with Robaxin).   donepezil 10 MG tablet Commonly known as:  ARICEPT Take 10 mg by mouth at bedtime.   ENSURE ENLIVE PO Take 1 Bottle by mouth 2 (two) times daily between meals.   feeding supplement (PRO-STAT SUGAR FREE 64) Liqd Take 30 mLs by mouth 2 (two) times daily between meals. low protein, low albumin   folic acid 1 MG tablet Commonly known as:  FOLVITE Take 1 mg by mouth daily.   levothyroxine 25 MCG tablet Commonly known as:  SYNTHROID, LEVOTHROID TAKE ONE TABLET BY MOUTH ONCE DAILY   methocarbamol 500 MG tablet Commonly known as:  ROBAXIN Take 1 tablet (500 mg total) by mouth every 6 (six) hours as needed for muscle spasms.   mirtazapine 7.5 MG tablet Commonly known as:  REMERON Take 7.5 mg by mouth  at bedtime.   oxyCODONE 5 MG immediate release tablet Commonly known as:  Oxy IR/ROXICODONE Take 1 tablet (5 mg total) by mouth every 4 (four) hours as needed for moderate pain.   QUEtiapine 50 MG tablet Commonly known as:  SEROQUEL Take 100 mg by mouth at bedtime. 2 tabs   RISA-BID PROBIOTIC Tabs Take 1 tablet by mouth 2 (two) times daily.   sertraline 50 MG tablet Commonly known as:  ZOLOFT TAKE ONE TABLET BY MOUTH ONCE DAILY *FOLLOW  UP  WITH  PCP  FOR  MORE  REFILLS*   traMADol 50 MG  tablet Commonly known as:  ULTRAM Take 50 mg by mouth 2 (two) times daily. pain- scheduled. Hold for sedation   zinc gluconate 50 MG tablet Take 50 mg by mouth daily.       If you experience worsening of your admission symptoms, develop shortness of breath, life threatening emergency, suicidal or homicidal thoughts you must seek medical attention immediately by calling 911 or calling your MD immediately  if symptoms less severe.  You Must read complete instructions/literature along with all the possible adverse reactions/side effects for all the Medicines you take and that have been prescribed to you. Take any new Medicines after you have completely understood and accept all the possible adverse reactions/side effects.   Please note  You were cared for by a hospitalist during your hospital stay. If you have any questions about your discharge medications or the care you received while you were in the hospital after you are discharged, you can call the unit and asked to speak with the hospitalist on call if the hospitalist that took care of you is not available. Once you are discharged, your primary care physician will handle any further medical issues. Please note that NO REFILLS for any discharge medications will be authorized once you are discharged, as it is imperative that you return to your primary care physician (or establish a relationship with a primary care physician if you do not have one) for your aftercare needs so that they can reassess your need for medications and monitor your lab values. Today   SUBJECTIVE   confused  VITAL SIGNS:  Blood pressure (!) 147/56, pulse 88, temperature 98.2 F (36.8 C), temperature source Oral, resp. rate 18, height  (1.549 m), weight 61.2 kg (135 lb), SpO2 95 %.  I/O:    Intake/Output Summary (Last 24 hours) at 12/29/2017 0745 Last data filed at 12/29/2017 0559 Gross per 24 hour  Intake -  Output 950 ml  Net -950 ml    PHYSICAL  EXAMINATION:  GENERAL:  84 y.o.-year-old patient lying in the bed with no acute distress.  EYES: Pupils equal, round, reactive to light and accommodation. HEENT: Head atraumatic, normocephalic. Oropharynx and nasopharynx clear.  NECK:  Supple, no jugular venous distention. No thyroid enlargement, no tenderness.  LUNGS: Normal breath sounds bilaterally, no wheezing, rales,rhonchi or crepitation. No use of accessory muscles of respiration.  CARDIOVASCULAR: S1, S2 normal. No murmurs, rubs, or gallops.  ABDOMEN: Soft, non-tender, non-distended. Bowel sounds present.   EXTREMITIES: No pedal edema, cyanosis, or clubbing.  NEUROLOGIC:confused  PSYCHIATRIC: The patient is alert but confused  SKIN: No obvious rash, lesion, or ulcer.   DATA REVIEW:   CBC  Recent Labs  Lab 12/28/17 0455  WBC 10.9  HGB 12.7  HCT 37.2  PLT 257    Chemistries  Recent Labs  Lab 12/28/17 0455  NA 138  K 4.5  CL 108  CO2 25  GLUCOSE 130*  BUN 17  CREATININE 0.70  CALCIUM 9.1    Microbiology Results   Recent Results (from the past 240 hour(s))  Surgical pcr screen     Status: None   Collection Time: 12/28/17  3:15 AM  Result Value Ref Range Status   MRSA, PCR NEGATIVE NEGATIVE Final   Staphylococcus aureus NEGATIVE NEGATIVE Final    Comment: (NOTE) The Xpert SA Assay (FDA approved for NASAL specimens in patients 39 years of age and older), is one component of a comprehensive surveillance program. It is not intended to diagnose infection nor to guide or monitor treatment. Performed at [facility], [address] [place] [place], [place] [address]     RADIOLOGY:  Dg Chest 1 View  Result Date: 12/27/2017 CLINICAL DATA:  Fall EXAM: CHEST  1 VIEW COMPARISON:  10/17/2017 FINDINGS: No acute opacity or pleural effusion. Cardiomediastinal silhouette within normal limits. Aortic atherosclerosis. No pneumothorax. Old right-sided rib fractures. IMPRESSION: No active disease. Electronically Signed    By: Jasmine Pang M.D.   On: 12/27/2017 21:14   Ct Hip Right Wo Contrast  Result Date: 12/27/2017 CLINICAL DATA:  Right hip pain after fall. History right femoral nail fixation 3 weeks ago for fracture. EXAM: CT OF THE RIGHT HIP WITHOUT CONTRAST TECHNIQUE: Multidetector CT imaging of the right hip was performed according to the standard protocol. Multiplanar CT image reconstructions were also generated. COMPARISON:  Preoperative study from 10/17/2017 of the right hip, intraoperative study from 10/17/2017 and radiographs of the right hip from 12/27/2017. FINDINGS: Bones/Joint/Cartilage New periprosthetic, comminuted spiral appearing fracture of the proximal right femur is noted starting from the tail of the nail blade and terminating adjacent to the tip of the uncemented femoral stem, series 5/34 through 41. Approximately 10 degrees of dorsal angulation of the femoral shaft is identified, series 5/36 due to the apex volar fracture. Subacute pre-existing fractures of the greater and lesser trochanters with some interval callus formation about the greater trochanter is noted. The lesser trochanter remains avulsed without callus. The distal locking screw of the femoral nail does not appear to be engaged within the nail and is seen posteromedial to the femoral component, series 2/83. Ligaments Suboptimally assessed by CT. Muscles and Tendons No intramuscular hemorrhage or atrophy. Soft tissues Postop scarring along the lateral aspect of the right hip. No significant joint effusion. IMPRESSION: 1. New slightly comminuted apex anterior fracture of the proximal right femoral diaphysis encircling the uncemented femoral component of a pre-existing intramedullary nail fixation. The distal interlocking screw of the nail appears to have disengaged from its aperture. 2. Subacute pre-existing greater and lesser trochanteric fractures of the femur. Electronically Signed   By: Tollie Eth M.D.   On: 12/27/2017 22:37   Dg Hip  Unilat W Or Wo Pelvis 2-3 Views Right  Result Date: 12/27/2017 CLINICAL DATA:  Fall EXAM: DG HIP (WITH OR WITHOUT PELVIS) 2-3V RIGHT COMPARISON:  Intraoperative fluoroscopy 10/17/2017 FINDINGS: There is a periprosthetic fracture along the medial aspects of the proximal femoral shaft, just superior to the transverse screw crossing the intramedullary femoral rod. Contour abnormality along the inferior aspect of the greater trochanter and medially displaced lesser trochanter fracture fragments may be a sequela the previous fracture and associated internal fixation. Comparison is somewhat limited to the intraoperative views. IMPRESSION: Periprosthetic fracture along the medial cortex of the proximal right femur. Displacement of the greater and lesser trochanter is favored to be related to the prior trauma. Electronically Signed  By: Deatra Robinson M.D.   On: 12/27/2017 21:16       CODE STATUS:     Code Status Orders  (From admission, onward)        Start     Ordered   12/28/17 1103  Do not attempt resuscitation (DNR)  Continuous    Question Answer Comment  In the event of cardiac or respiratory ARREST Do not call a "code blue"   In the event of cardiac or respiratory ARREST Do not perform Intubation, CPR, defibrillation or ACLS   In the event of cardiac or respiratory ARREST Use medication by any route, position, wound care, and other measures to relive pain and suffering. May use oxygen, suction and manual treatment of airway obstruction as needed for comfort.      12/28/17 1103    Code Status History    Date Active Date Inactive Code Status Order ID Comments User Context   12/27/2017 2338 12/28/2017 1103 Full Code [id number]  Oralia Manis, MD Inpatient   10/17/2017 0435 10/19/2017 1625 Full Code [id number]  Oralia Manis, MD Inpatient    Advance Directive Documentation     Most Recent Value  Type of Advance Directive  Healthcare Power of Attorney  Pre-existing out of facility DNR order  (yellow form or pink MOST form)  -  "MOST" Form in Place?  -      TOTAL TIME TAKING CARE OF THIS PATIENT: *40* minutes.    Enedina Finner M.D on 12/29/2017 at 7:45 AM  Between 7am to 6pm - Pager - [phone] After 6pm go to [email or web address] - Social research officer, government  [facility]  Office  [phone]  CC: Primary care physician; Patrice Paradise, MD

## 2017-12-29 NOTE — Progress Notes (Signed)
Discharge summary reviewed with verbal understanding. Rxs given to daughter. EMS called for transport.

## 2017-12-29 NOTE — Care Management (Signed)
EMS packet completed with DNR.  Clydie Braun with [facility] notified. No other RNCM needs.

## 2018-02-14 DIAGNOSIS — R269 Unspecified abnormalities of gait and mobility: Secondary | ICD-10-CM | POA: Diagnosis not present

## 2018-02-14 DIAGNOSIS — Z7401 Bed confinement status: Secondary | ICD-10-CM | POA: Diagnosis not present

## 2018-02-23 DIAGNOSIS — K219 Gastro-esophageal reflux disease without esophagitis: Secondary | ICD-10-CM | POA: Diagnosis not present

## 2018-02-23 DIAGNOSIS — S72141D Displaced intertrochanteric fracture of right femur, subsequent encounter for closed fracture with routine healing: Secondary | ICD-10-CM | POA: Diagnosis not present

## 2018-02-23 DIAGNOSIS — E45 Retarded development following protein-calorie malnutrition: Secondary | ICD-10-CM | POA: Diagnosis not present

## 2018-02-23 DIAGNOSIS — R634 Abnormal weight loss: Secondary | ICD-10-CM | POA: Diagnosis not present

## 2018-02-23 DIAGNOSIS — I10 Essential (primary) hypertension: Secondary | ICD-10-CM | POA: Diagnosis not present

## 2018-02-23 DIAGNOSIS — E782 Mixed hyperlipidemia: Secondary | ICD-10-CM | POA: Diagnosis not present

## 2018-02-23 DIAGNOSIS — F0390 Unspecified dementia without behavioral disturbance: Secondary | ICD-10-CM | POA: Diagnosis not present

## 2018-02-23 DIAGNOSIS — F419 Anxiety disorder, unspecified: Secondary | ICD-10-CM | POA: Diagnosis not present

## 2018-02-23 DIAGNOSIS — M1990 Unspecified osteoarthritis, unspecified site: Secondary | ICD-10-CM | POA: Diagnosis not present

## 2018-04-15 ENCOUNTER — Other Ambulatory Visit: Payer: Self-pay | Admitting: Pharmacist

## 2018-04-15 NOTE — Patient Outreach (Signed)
[facility]) Care Management  04/15/2018  [name] 12/21/1932 [id number]   Incoming call from Rosanne Gutting, daughter of Joann [name], on behalf of the patient in response to the EMMI Medication Adherence Campaign. HIPAA identifiers verified and verbal consent received. Note that [name] is listed in Epic on patient's [employer].  [name] reports that [name] is currently taking her atorvastatin 10 mg each day as directed. Counsel on value of medication adherence. Reports that she uses a weekly pillbox to organize the patient's medications and denies the patient missing any doses as she reports that the patient currently has around the clock care from herself and a caregiver. Reports that her mom was in rehab several months ago and currently receives hospice care.   Denies any further medication question/concerns at this time. Provide daughter with my phone number.  Will close pharmacy episode.  Duanne Moron, PharmD, [facility] Clinical Pharmacist [facility] Care Management [phone]

## 2018-05-06 DIAGNOSIS — M8000XD Age-related osteoporosis with current pathological fracture, unspecified site, subsequent encounter for fracture with routine healing: Secondary | ICD-10-CM | POA: Diagnosis not present

## 2018-05-06 DIAGNOSIS — E782 Mixed hyperlipidemia: Secondary | ICD-10-CM | POA: Diagnosis not present

## 2018-05-06 DIAGNOSIS — R1310 Dysphagia, unspecified: Secondary | ICD-10-CM | POA: Diagnosis not present

## 2018-05-06 DIAGNOSIS — S72001S Fracture of unspecified part of neck of right femur, sequela: Secondary | ICD-10-CM | POA: Diagnosis not present

## 2018-05-06 DIAGNOSIS — G301 Alzheimer's disease with late onset: Secondary | ICD-10-CM | POA: Diagnosis not present

## 2018-05-06 DIAGNOSIS — Z7409 Other reduced mobility: Secondary | ICD-10-CM | POA: Diagnosis not present

## 2018-05-06 DIAGNOSIS — F0281 Dementia in other diseases classified elsewhere with behavioral disturbance: Secondary | ICD-10-CM | POA: Diagnosis not present

## 2018-05-06 DIAGNOSIS — Z9889 Other specified postprocedural states: Secondary | ICD-10-CM | POA: Diagnosis not present

## 2018-05-06 DIAGNOSIS — I10 Essential (primary) hypertension: Secondary | ICD-10-CM | POA: Diagnosis not present

## 2018-05-06 DIAGNOSIS — E039 Hypothyroidism, unspecified: Secondary | ICD-10-CM | POA: Diagnosis not present

## 2018-05-06 DIAGNOSIS — H3530 Unspecified macular degeneration: Secondary | ICD-10-CM | POA: Diagnosis not present

## 2018-05-17 DIAGNOSIS — Z515 Encounter for palliative care: Secondary | ICD-10-CM | POA: Diagnosis not present

## 2018-05-17 DIAGNOSIS — G301 Alzheimer's disease with late onset: Secondary | ICD-10-CM | POA: Diagnosis not present

## 2018-05-20 DIAGNOSIS — F0391 Unspecified dementia with behavioral disturbance: Secondary | ICD-10-CM | POA: Diagnosis not present

## 2018-05-23 DIAGNOSIS — R197 Diarrhea, unspecified: Secondary | ICD-10-CM | POA: Diagnosis not present

## 2018-06-20 DIAGNOSIS — F0391 Unspecified dementia with behavioral disturbance: Secondary | ICD-10-CM | POA: Diagnosis not present

## 2018-07-20 DIAGNOSIS — F0391 Unspecified dementia with behavioral disturbance: Secondary | ICD-10-CM | POA: Diagnosis not present

## 2018-08-20 DIAGNOSIS — F0391 Unspecified dementia with behavioral disturbance: Secondary | ICD-10-CM | POA: Diagnosis not present

## 2018-09-05 ENCOUNTER — Other Ambulatory Visit: Payer: Self-pay | Admitting: Pharmacist

## 2018-09-05 NOTE — Patient Outreach (Signed)
[facility]) Care Management  09/05/2018  Joann [name] [birth year] [id number]   Incoming call from Rosanne Gutting, daughter of Joann [name], on behalf of the patient in response to the EMMI Medication Adherence Campaign. HIPAA identifiers verified and verbal consent received. Note that [name] is listed in chart on patient's Designated Party Release.  [name] reports that [name] takes her atorvastatin once daily as directed. Denies any missed doses or barriers to adherence. Note that I last spoke with this caregiver on 04/15/18. Reports that she is continuing to use a weekly pillbox for the patient. Denies any current medication questions or concerns.  Will close pharmacy episode.  Duanne Moron, PharmD, [facility] Clinical Pharmacist [facility] Care Management [phone]

## 2018-09-20 DIAGNOSIS — F0391 Unspecified dementia with behavioral disturbance: Secondary | ICD-10-CM | POA: Diagnosis not present

## 2018-09-26 DIAGNOSIS — R300 Dysuria: Secondary | ICD-10-CM | POA: Diagnosis not present

## 2018-10-19 DIAGNOSIS — F0391 Unspecified dementia with behavioral disturbance: Secondary | ICD-10-CM | POA: Diagnosis not present

## 2018-11-19 DIAGNOSIS — F0391 Unspecified dementia with behavioral disturbance: Secondary | ICD-10-CM | POA: Diagnosis not present

## 2018-11-23 DIAGNOSIS — R300 Dysuria: Secondary | ICD-10-CM | POA: Diagnosis not present

## 2018-11-23 DIAGNOSIS — R8290 Unspecified abnormal findings in urine: Secondary | ICD-10-CM | POA: Diagnosis not present

## 2018-12-19 DIAGNOSIS — F0391 Unspecified dementia with behavioral disturbance: Secondary | ICD-10-CM | POA: Diagnosis not present

## 2019-01-04 DIAGNOSIS — R410 Disorientation, unspecified: Secondary | ICD-10-CM | POA: Diagnosis not present

## 2019-01-19 DIAGNOSIS — F0391 Unspecified dementia with behavioral disturbance: Secondary | ICD-10-CM | POA: Diagnosis not present

## 2019-02-17 ENCOUNTER — Emergency Department: Payer: PPO

## 2019-02-17 ENCOUNTER — Other Ambulatory Visit: Payer: Self-pay

## 2019-02-17 ENCOUNTER — Emergency Department
Admission: EM | Admit: 2019-02-17 | Discharge: 2019-02-17 | Disposition: A | Discharge status: Home / Self Care | Payer: PPO | Attending: Emergency Medicine | Admitting: Emergency Medicine

## 2019-02-17 ENCOUNTER — Encounter: Payer: Self-pay | Admitting: Intensive Care

## 2019-02-17 DIAGNOSIS — R55 Syncope and collapse: Secondary | ICD-10-CM | POA: Diagnosis not present

## 2019-02-17 DIAGNOSIS — Z79899 Other long term (current) drug therapy: Secondary | ICD-10-CM | POA: Insufficient documentation

## 2019-02-17 DIAGNOSIS — F17210 Nicotine dependence, cigarettes, uncomplicated: Secondary | ICD-10-CM | POA: Insufficient documentation

## 2019-02-17 DIAGNOSIS — F0390 Unspecified dementia without behavioral disturbance: Secondary | ICD-10-CM | POA: Insufficient documentation

## 2019-02-17 DIAGNOSIS — R319 Hematuria, unspecified: Secondary | ICD-10-CM | POA: Diagnosis not present

## 2019-02-17 DIAGNOSIS — R52 Pain, unspecified: Secondary | ICD-10-CM | POA: Diagnosis not present

## 2019-02-17 DIAGNOSIS — I10 Essential (primary) hypertension: Secondary | ICD-10-CM | POA: Diagnosis not present

## 2019-02-17 DIAGNOSIS — R110 Nausea: Secondary | ICD-10-CM | POA: Diagnosis not present

## 2019-02-17 DIAGNOSIS — E039 Hypothyroidism, unspecified: Secondary | ICD-10-CM | POA: Diagnosis not present

## 2019-02-17 DIAGNOSIS — M25551 Pain in right hip: Secondary | ICD-10-CM | POA: Diagnosis not present

## 2019-02-17 DIAGNOSIS — M25552 Pain in left hip: Secondary | ICD-10-CM | POA: Insufficient documentation

## 2019-02-17 DIAGNOSIS — R0902 Hypoxemia: Secondary | ICD-10-CM | POA: Diagnosis not present

## 2019-02-17 DIAGNOSIS — I2510 Atherosclerotic heart disease of native coronary artery without angina pectoris: Secondary | ICD-10-CM | POA: Insufficient documentation

## 2019-02-17 DIAGNOSIS — M2550 Pain in unspecified joint: Secondary | ICD-10-CM | POA: Diagnosis not present

## 2019-02-17 DIAGNOSIS — S79911A Unspecified injury of right hip, initial encounter: Secondary | ICD-10-CM | POA: Diagnosis not present

## 2019-02-17 DIAGNOSIS — R918 Other nonspecific abnormal finding of lung field: Secondary | ICD-10-CM | POA: Diagnosis not present

## 2019-02-17 DIAGNOSIS — N390 Urinary tract infection, site not specified: Secondary | ICD-10-CM | POA: Insufficient documentation

## 2019-02-17 DIAGNOSIS — Z7401 Bed confinement status: Secondary | ICD-10-CM | POA: Diagnosis not present

## 2019-02-17 DIAGNOSIS — R61 Generalized hyperhidrosis: Secondary | ICD-10-CM | POA: Diagnosis not present

## 2019-02-17 LAB — CBC WITH DIFFERENTIAL/PLATELET
Abs Immature Granulocytes: 0.03 10*3/uL (ref 0.00–0.07)
Basophils Absolute: 0.1 10*3/uL (ref 0.0–0.1)
Basophils Relative: 1 %
Eosinophils Absolute: 0.4 10*3/uL (ref 0.0–0.5)
Eosinophils Relative: 4 %
HCT: 42.7 % (ref 36.0–46.0)
Hemoglobin: 13.7 g/dL (ref 12.0–15.0)
Immature Granulocytes: 0 %
Lymphocytes Relative: 25 %
Lymphs Abs: 2.4 10*3/uL (ref 0.7–4.0)
MCH: 30 pg (ref 26.0–34.0)
MCHC: 32.1 g/dL (ref 30.0–36.0)
MCV: 93.6 fL (ref 80.0–100.0)
Monocytes Absolute: 0.5 10*3/uL (ref 0.1–1.0)
Monocytes Relative: 6 %
Neutro Abs: 6 10*3/uL (ref 1.7–7.7)
Neutrophils Relative %: 64 %
Platelets: 273 10*3/uL (ref 150–400)
RBC: 4.56 MIL/uL (ref 3.87–5.11)
RDW: 14.1 % (ref 11.5–15.5)
WBC: 9.4 10*3/uL (ref 4.0–10.5)
nRBC: 0 % (ref 0.0–0.2)

## 2019-02-17 LAB — COMPREHENSIVE METABOLIC PANEL
ALT: 20 U/L (ref 0–44)
AST: 18 U/L (ref 15–41)
Albumin: 3.4 g/dL — ABNORMAL LOW (ref 3.5–5.0)
Alkaline Phosphatase: 84 U/L (ref 38–126)
Anion gap: 10 (ref 5–15)
BUN: 14 mg/dL (ref 8–23)
CO2: 25 mmol/L (ref 22–32)
Calcium: 9.2 mg/dL (ref 8.9–10.3)
Chloride: 106 mmol/L (ref 98–111)
Creatinine, Ser: 0.75 mg/dL (ref 0.44–1.00)
GFR calc Af Amer: >60 mL/min (ref >60)
GFR calc non Af Amer: >60 mL/min (ref >60)
Glucose, Bld: 139 mg/dL — ABNORMAL HIGH (ref 70–99)
Potassium: 3.7 mmol/L (ref 3.5–5.1)
Sodium: 141 mmol/L (ref 135–145)
Total Bilirubin: 0.5 mg/dL (ref 0.3–1.2)
Total Protein: 6.8 g/dL (ref 6.5–8.1)

## 2019-02-17 LAB — URINALYSIS, COMPLETE (UACMP) WITH MICROSCOPIC
Bilirubin Urine: NEGATIVE
Glucose, UA: NEGATIVE mg/dL
Hgb urine dipstick: NEGATIVE
Ketones, ur: NEGATIVE mg/dL
Nitrite: POSITIVE — AB
Protein, ur: NEGATIVE mg/dL
Specific Gravity, Urine: 1.018 (ref 1.005–1.030)
pH: 5 (ref 5.0–8.0)

## 2019-02-17 LAB — TROPONIN I (HIGH SENSITIVITY)
Troponin I (High Sensitivity): 3 ng/L (ref <18)
Troponin I (High Sensitivity): 4 ng/L (ref <18)

## 2019-02-17 MED ORDER — CEPHALEXIN 500 MG PO CAPS: 500.0000 mg | ORAL_CAPSULE | Freq: Three times a day (TID) | ORAL | 0 refills | Status: AC

## 2019-02-17 NOTE — ED Notes (Signed)
pts family updated about pt leaving facility

## 2019-02-17 NOTE — ED Notes (Signed)
Pt stood with minimal assistance.  Pt able to take 1-2 steps forward and then back to bed.  Pt is unsteady on her feet, but ambulated with assistance.

## 2019-02-17 NOTE — ED Notes (Signed)
ED secretary made aware to contact EMS for patients transport back home

## 2019-02-17 NOTE — ED Notes (Signed)
Patient transported to X-ray 

## 2019-02-17 NOTE — ED Notes (Signed)
Patient back from  X-ray 

## 2019-02-17 NOTE — Discharge Instructions (Addendum)
Please return if you feel any worse.  Please call your doctor and have them follow-up with you tomorrow or see the cardiologist tomorrow.  Dr. Clayborn Bigness is on call.  He is very good.  He should go to see you rapidly if you call the office in the morning. Please take the Keflex 1 pill 3 times a day for the UTI.

## 2019-02-17 NOTE — ED Provider Notes (Signed)
Vitals:   02/17/19 1658 02/17/19 1704  BP:    Pulse: 66 65  Resp: 16 15  Temp:    SpO2: 97% 95%     Patient alert.  Resting in no distress.  Confirmed with EMS, prolonged weight for transport but will be coming to transport patient.   Delman Kitten, MD 02/17/19 [phone]

## 2019-02-17 NOTE — ED Notes (Signed)
[facility]  CALLED  FOR  TRANSPORT  HOME 

## 2019-02-17 NOTE — ED Notes (Signed)
Patient given warm blanket.

## 2019-02-17 NOTE — ED Triage Notes (Signed)
Patient arrived by EMS from home for syncopal episode while using the restroom. Patient extremely diaphoretic when EMS arrived. Patient alert to self and situation. Hx dementia

## 2019-02-17 NOTE — ED Provider Notes (Addendum)
[facility] Emergency Department Provider Note   ____________________________________________   First MD Initiated Contact with Patient 02/17/19 1021     (approximate)  I have reviewed the triage vital signs and the nursing notes.   HISTORY  Chief Complaint Loss of Consciousness Chief complaint syncope History somewhat limited by dementia HPI Joann [name] is a 85 y.o. female with dementia who was at home.  She went to go to the bathroom to urinate and apparently passed out.  EMS reports when they got there she was very sweaty.  She complained of some nausea.  She did not have any chest pain or shortness of breath.  Sweatiness is since resolved.  Patient cannot really tell me what happened.  Patient is not complaining of any pain except for where her tourniquet is on one arm where blood is being drawn and on the other arm where the blood pressure cuff is inflating.  When these things are finished the pain goes away.         Past Medical History:  Diagnosis Date   Anxiety    Anxiety disorder    Arthritis    Chicken pox    Colon polyp    GERD (gastroesophageal reflux disease)    Hyperlipidemia    Hypertension    Macular degeneration    Dr. Allyne Gee in [facility]   Thyroid disease     Patient Active Problem List   Diagnosis Date Noted   Status post hip surgery 11/10/2017   Protein-calorie malnutrition (HCC) 10/29/2017   Closed right hip fracture (HCC) 10/17/2017   GERD (gastroesophageal reflux disease) 10/17/2017   Hypothyroidism 10/17/2017   Dementia with behavioral disturbance (HCC) 12/11/2014   Rosacea 10/04/2014   Hallucination, visual 10/04/2014   B12 deficiency 10/04/2014   Macular degeneration 07/10/2013   Screening for breast cancer 05/23/2013   Carpal tunnel syndrome 11/21/2012   Medicare annual wellness visit, subsequent 07/18/2012   Hypertension 04/06/2012   HLD (hyperlipidemia) 04/06/2012    Past  Surgical History:  Procedure Laterality Date   APPENDECTOMY  1942   INTRAMEDULLARY (IM) NAIL INTERTROCHANTERIC Right 10/17/2017   Procedure: INTRAMEDULLARY (IM) NAIL INTERTROCHANTRIC;  Surgeon: Deeann Saint, MD;  Location: [facility] ORS;  Service: Orthopedics;  Laterality: Right;   KNEE DISLOCATION SURGERY Right 1996   TOTAL ABDOMINAL HYSTERECTOMY W/ BILATERAL SALPINGOOPHORECTOMY  1971    Prior to Admission medications   Medication Sig Start Date End Date Taking? Authorizing Provider  acetaminophen (TYLENOL) 325 MG tablet Take 650 mg by mouth 4 (four) times daily.    [provider]  Amino Acids-Protein Hydrolys (FEEDING SUPPLEMENT, PRO-STAT SUGAR FREE 64,) LIQD Take 30 mLs by mouth 2 (two) times daily between meals. low protein, low albumin    [provider]  atorvastatin (LIPITOR) 10 MG tablet Take 10 mg by mouth daily. 04/04/18   [provider]  cephALEXin (KEFLEX) 500 MG capsule Take 1 capsule (500 mg total) by mouth every 12 (twelve) hours. 12/29/17   Enedina Finner, MD  cephALEXin (KEFLEX) 500 MG capsule Take 1 capsule (500 mg total) by mouth 3 (three) times daily for 10 days. 02/17/19 02/27/19  Arnaldo Natal, MD  Cholecalciferol 4000 units CAPS Take 1 capsule by mouth daily.    [provider]  diazepam (VALIUM) 2 MG tablet Take 1 tablet (2 mg total) by mouth every 8 (eight) hours as needed for muscle spasms (unrelieved with Robaxin). 12/29/17   Enedina Finner, MD  donepezil (ARICEPT) 10 MG tablet Take  10 mg by mouth at bedtime.  09/13/17   [provider]  folic acid (FOLVITE) 1 MG tablet Take 1 mg by mouth daily.    [provider]  levothyroxine (SYNTHROID, LEVOTHROID) 25 MCG tablet TAKE ONE TABLET BY MOUTH ONCE DAILY 01/02/16   Shelia MediaWalker, Jennifer A, MD  Lidocaine (ASPERCREME LIDOCAINE) 4 % PTCH Apply 1 patch topically daily. Apply to right hip daily for pain. Remove after 12 hours    [provider]  methocarbamol (ROBAXIN) 500 MG  tablet Take 1 tablet (500 mg total) by mouth every 6 (six) hours as needed for muscle spasms. 12/29/17   Enedina FinnerPatel, Sona, MD  mirtazapine (REMERON) 7.5 MG tablet Take 7.5 mg by mouth at bedtime.  09/13/17   [provider]  Nutritional Supplements (ENSURE ENLIVE PO) Take 1 Bottle by mouth 2 (two) times daily between meals.    [provider]  oxyCODONE (OXY IR/ROXICODONE) 5 MG immediate release tablet Take 1 tablet (5 mg total) by mouth every 4 (four) hours as needed for moderate pain. 12/29/17   Enedina FinnerPatel, Sona, MD  Probiotic Product (RISA-BID PROBIOTIC) TABS Take 1 tablet by mouth 2 (two) times daily.    [provider]  QUEtiapine (SEROQUEL) 50 MG tablet Take 100 mg by mouth at bedtime. 2 tabs 09/14/17   [provider]  sertraline (ZOLOFT) 50 MG tablet TAKE ONE TABLET BY MOUTH ONCE DAILY *FOLLOW  UP  WITH  PCP  FOR  MORE  REFILLS* 06/04/16   Tommie Samsook, Jayce G, DO  traMADol (ULTRAM) 50 MG tablet Take 50 mg by mouth 2 (two) times daily. pain- scheduled. Hold for sedation     [provider]  zinc gluconate 50 MG tablet Take 50 mg by mouth daily.    [provider]    Allergies Codeine  Family History  Problem Relation Age of Onset   Heart disease Mother    Hypertension Mother    Parkinsonism Mother    Heart disease Father    Hypertension Father    Cancer Father        bone   Heart disease Brother     Social History Social History   Tobacco Use   Smoking status: Current Every Day Smoker    Packs/day: 0.50    Types: Cigarettes   Smokeless tobacco: Never Used  Substance Use Topics   Alcohol use: Yes    Comment: rarely   Drug use: No    Review of Systems  Constitutional: No fever/chills Eyes: No visual changes. ENT: No sore throat. Cardiovascular: Denies chest pain. Respiratory: Denies shortness of breath. Gastrointestinal: No abdominal pain.  No nausea, no vomiting.  No diarrhea.  No constipation. Genitourinary: Negative  for dysuria. Musculoskeletal: Negative for back pain. Skin: Negative for rash. Neurological: Negative for headaches, focal weakness   ____________________________________________   PHYSICAL EXAM:  VITAL SIGNS: ED Triage Vitals  Enc Vitals Group     BP      Pulse      Resp      Temp      Temp src      SpO2      Weight      Height      Head Circumference      Peak Flow      Pain Score      Pain Loc      Pain Edu?      Excl. in GC?     Constitutional: Alert  Well appearing and  in no acute distress. Eyes: Conjunctivae are normal.  Head: Atraumatic. Nose: No congestion/rhinnorhea. Mouth/Throat: Mucous membranes are moist.  Oropharynx non-erythematous. Neck: No stridor.  Cardiovascular: Normal rate, regular rhythm. Grossly normal heart sounds.  Good peripheral circulation. Respiratory: Normal respiratory effort.  No retractions. Lungs CTAB. Gastrointestinal: Soft and nontender. No distention. No abdominal bruits. No CVA tenderness. Musculoskeletal: No lower extremity tenderness nor edema.   Neurologic:  Normal speech and language. No gross focal neurologic deficits are appreciated.  Skin:  Skin is warm, dry and intact. No rash noted.   ____________________________________________   LABS (all labs ordered are listed, but only abnormal results are displayed)  Labs Reviewed  COMPREHENSIVE METABOLIC PANEL - Abnormal; Notable for the following components:      Result Value   Glucose, Bld 139 (*)    Albumin 3.4 (*)    All other components within normal limits  URINALYSIS, COMPLETE (UACMP) WITH MICROSCOPIC - Abnormal; Notable for the following components:   Color, Urine YELLOW (*)    APPearance HAZY (*)    Nitrite POSITIVE (*)    Leukocytes,Ua SMALL (*)    Bacteria, UA RARE (*)    All other components within normal limits  CBC WITH DIFFERENTIAL/PLATELET  TROPONIN I (HIGH SENSITIVITY)  TROPONIN I (HIGH SENSITIVITY)    ____________________________________________  EKG  EKG read and interpreted by me shows normal sinus rhythm rate of 61 normal axis there are flipped T's in 1 and L and V5 and V6 but these were present as far back as March 2019.  EKG really shows no new changes. ____________________________________________  RADIOLOGY  ED MD interpretation:    Official radiology report(s): Ct Head Wo Contrast  Result Date: 02/17/2019 CLINICAL DATA:  Syncopal episode. Diaphoresis. History of dementia. EXAM: CT HEAD WITHOUT CONTRAST CT CERVICAL SPINE WITHOUT CONTRAST TECHNIQUE: Multidetector CT imaging of the head and cervical spine was performed following the standard protocol without intravenous contrast. Multiplanar CT image reconstructions of the cervical spine were also generated. COMPARISON:  Prior studies 10/17/2017. FINDINGS: CT HEAD FINDINGS Brain: There is no evidence of acute intracranial hemorrhage, mass lesion, brain edema or extra-axial fluid collection. Stable atrophy with prominence of the ventricles and subarachnoid spaces. There are minimal chronic small vessel ischemic changes in the periventricular white matter. There is no CT evidence of acute cortical infarction. Vascular: Prominent intracranial vascular calcifications. No hyperdense vessel identified. Skull: Negative for fracture or focal lesion. Sinuses/Orbits: The visualized paranasal sinuses and mastoid air cells are clear. No orbital abnormalities are seen. Other: None. CT CERVICAL SPINE FINDINGS Alignment: Stable with reversal of the usual cervical lordosis and a slight anterolisthesis at C3-4 and C4-5. Skull base and vertebrae: No evidence of acute fracture or traumatic subluxation. Soft tissues and spinal canal: No prevertebral fluid or swelling. No visible canal hematoma. Disc levels: Stable spondylosis with loss of disc height and uncinate spurring greatest at C5-6 and C6-7. No large disc herniation. Upper chest: No significant findings.  Other: Bilateral carotid atherosclerosis. IMPRESSION: 1. Stable head CT without acute or significant findings. Stable mild atrophy. 2. No evidence of acute cervical spine fracture, traumatic subluxation or static signs of instability. 3. Stable mild cervical spondylosis. Electronically Signed   By: Carey BullocksWilliam  Veazey M.D.   On: 02/17/2019 11:09   Ct Cervical Spine Wo Contrast  Result Date: 02/17/2019 CLINICAL DATA:  Syncopal episode. Diaphoresis. History of dementia. EXAM: CT HEAD WITHOUT CONTRAST CT CERVICAL SPINE WITHOUT CONTRAST TECHNIQUE: Multidetector CT imaging of the head and cervical spine was performed  following the standard protocol without intravenous contrast. Multiplanar CT image reconstructions of the cervical spine were also generated. COMPARISON:  Prior studies 10/17/2017. FINDINGS: CT HEAD FINDINGS Brain: There is no evidence of acute intracranial hemorrhage, mass lesion, brain edema or extra-axial fluid collection. Stable atrophy with prominence of the ventricles and subarachnoid spaces. There are minimal chronic small vessel ischemic changes in the periventricular white matter. There is no CT evidence of acute cortical infarction. Vascular: Prominent intracranial vascular calcifications. No hyperdense vessel identified. Skull: Negative for fracture or focal lesion. Sinuses/Orbits: The visualized paranasal sinuses and mastoid air cells are clear. No orbital abnormalities are seen. Other: None. CT CERVICAL SPINE FINDINGS Alignment: Stable with reversal of the usual cervical lordosis and a slight anterolisthesis at C3-4 and C4-5. Skull base and vertebrae: No evidence of acute fracture or traumatic subluxation. Soft tissues and spinal canal: No prevertebral fluid or swelling. No visible canal hematoma. Disc levels: Stable spondylosis with loss of disc height and uncinate spurring greatest at C5-6 and C6-7. No large disc herniation. Upper chest: No significant findings. Other: Bilateral carotid  atherosclerosis. IMPRESSION: 1. Stable head CT without acute or significant findings. Stable mild atrophy. 2. No evidence of acute cervical spine fracture, traumatic subluxation or static signs of instability. 3. Stable mild cervical spondylosis. Electronically Signed   By: Carey BullocksWilliam  Veazey M.D.   On: 02/17/2019 11:09   Dg Chest Portable 1 View  Result Date: 02/17/2019 CLINICAL DATA:  Syncopal episode EXAM: PORTABLE CHEST 1 VIEW COMPARISON:  Chest x-rays dated 12/27/2017 and 10/17/2017. FINDINGS: Heart size and mediastinal contours are within normal limits. Atherosclerosis at the aortic arch. There are chronic bronchitic changes centrally. Additional coarse interstitial markings bilaterally suggests associated chronic interstitial lung disease. No confluent opacity to suggest a developing pneumonia. No pleural effusion or pneumothorax seen. Osseous structures about the chest are unremarkable. IMPRESSION: 1. No active disease. No evidence of pneumonia or pulmonary edema. 2. Chronic bronchitic changes and probable chronic interstitial lung disease. 3. Aortic atherosclerosis. Electronically Signed   By: Bary RichardStan  Maynard M.D.   On: 02/17/2019 10:44   Dg Hip Unilat W Or Wo Pelvis 2-3 Views Left  Result Date: 02/17/2019 CLINICAL DATA:  Left hip pain. EXAM: DG HIP (WITH OR WITHOUT PELVIS) 2-3V LEFT COMPARISON:  None. FINDINGS: There is no evidence of hip fracture or dislocation. Mild narrowed joint space with osteophyte formation is identified in the left hip. Prior fixation of right femur is noted. IMPRESSION: No acute fracture dislocation identified. Electronically Signed   By: Sherian ReinWei-Chen  Lin M.D.   On: 02/17/2019 12:42   Dg Hip Unilat W Or Wo Pelvis 2-3 Views Right  Result Date: 02/17/2019 CLINICAL DATA:  Pain after fall EXAM: DG HIP (WITH OR WITHOUT PELVIS) 2-3V RIGHT COMPARISON:  Dec 27, 2016 FINDINGS: A gamma nail and femoral rod are identified on the right. The intertrochanteric fractures seen on the previous  study appear to have healed. There is malalignment of the proximal femur adjacent to the distal aspect of the femoral rod which appears to represent a change. Left hip is intact on the single frontal view. Pelvic bones are intact. IMPRESSION: 1. There is a deformity of the proximal femur adjacent to the distal aspect of the femoral rod and interlocking screw. The alignment appears changed since Dec 27, 2017 and the findings are concerning for an acute on chronic fracture in this location. A discrete fracture line is not seen however and it is possible the malalignment occurred after rod placement in 2019  with healing at the site of malalignment. Recommend clinical correlation. No other evidence of acute fracture. Electronically Signed   By: Dorise Bullion III M.D   On: 02/17/2019 14:03    ____________________________________________   PROCEDURES  Procedure(s) performed (including Critical Care):  Procedures   ____________________________________________   INITIAL IMPRESSION / ASSESSMENT AND PLAN / ED COURSE  Patient's troponins are negative even after several hours.  EKG is unchanged from previously all her studies are within normal limits.  I believe she is safe to go home today as long as she has close follow-up.    The leg x-ray was reviewed with Dr. Earnestine Leys orthopedics.  I am able to move the leg through full range of motion without any pain.  There is no pain on compression or loading on the heel.  There is only some pain on palpation along the side of the thigh.  Both of [place] think that the patient is okay to go.  This looks old.          ____________________________________________   FINAL CLINICAL IMPRESSION(S) / ED DIAGNOSES  Final diagnoses:  Syncope and collapse  Urinary tract infection with hematuria, site unspecified     ED Discharge Orders         Ordered    cephALEXin (KEFLEX) 500 MG capsule  3 times daily     02/17/19 1521           Note:  This  document was prepared using Dragon voice recognition software and may include unintentional dictation errors.    Nena Polio, MD 02/17/19 7824    Nena Polio, MD 02/17/19 2353    Nena Polio, MD 02/17/19 [phone]

## 2019-02-17 NOTE — ED Notes (Signed)
Patient setup with sandwich tray and coke.

## 2019-02-18 DIAGNOSIS — F0391 Unspecified dementia with behavioral disturbance: Secondary | ICD-10-CM | POA: Diagnosis not present

## 2019-02-22 ENCOUNTER — Telehealth: Payer: Self-pay | Admitting: Student

## 2019-02-22 NOTE — Telephone Encounter (Signed)
Palliative NP spoke with patient's daughter Lattie Haw. Follow up visit scheduled for 7/16 at 10am.

## 2019-02-23 ENCOUNTER — Other Ambulatory Visit: Payer: PPO | Admitting: Student

## 2019-02-23 ENCOUNTER — Other Ambulatory Visit: Payer: Self-pay

## 2019-02-23 DIAGNOSIS — Z515 Encounter for palliative care: Secondary | ICD-10-CM | POA: Diagnosis not present

## 2019-02-23 NOTE — Progress Notes (Signed)
[facility] Consult Note Telephone: [phone]  Fax: [phone]  PATIENT NAME: Joann [name] DOB: Jan 17, 1933 MRN: [id number]  PRIMARY CARE PROVIDER:   Marinda Elk, MD  REFERRING PROVIDER:  Marinda Elk, MD [place],  [place] [address]  RESPONSIBLE PARTY:  Daughter, Joann [name]  ASSESSMENT: NP met with patient and her private caregiver Joann [name] present. Joann [name] is alert and oriented to person. She does have forgetfulness noted and repeats herself. She is able to answer direct questions. No acute distress noted; pleasant affect. We discussed ongoing goals of care and symptom management. NP spoke with daughter Joann [name] via phone. We discussed patient and recent ER visit. She is encouraged to call with questions. Palliative Medicine will continue to provide support; will make recommendations as needed.     RECOMMENDATIONS and PLAN:  1. Code status: DNR; present in the home.  2. Medical goals of therapy: Palliative Medicine will continue to provide support; will make recommendations as needed.  3. Symptom management: Pain-continue acetaminophen '500mg'$  for mild pain, take tramadol prn for moderate pain. Caregiver encouraged to take blood pressure when patient complains of dizziness. Monitor for falls/safety. Place pillows to help position leg. 4. Discharge Planning: Joann [name] will continue to reside at home with assistance from family and private caregivers.  Palliative Medicine will follow up in 8 weeks or sooner, if needed.  I spent 40 minutes providing this consultation,  from 10:00am to 10:40am. More than 50% of the time in this consultation was spent coordinating communication.   HISTORY OF PRESENT ILLNESS:  Joann [name] is a 85 y.o. female with multiple medical problems including dementia, abnormal weight loss, anxiety, hypertension, hyperlipidemia, osteoarthritis, GERD,  macular degeneration, hypothyroidism, hx of right hip fracture with ORIF. Palliative Care was asked to help address goals of care; she is seen for follow up visit today. She had been on hospice services, discharged due to stability. Joann [name] presently resides at home with daughter and has private caregivers. She is out of bed daily to geri chair. She is able to walk short distances with walker and assist x 1. She was seen in ER on 02/17/2019 due to syncopal episode when up to bedside commode. She is currently on antibiotics for urinary tract infection. Per caregiver Joann [name], she will occasionally complain of being dizzy. She denies constipation, bearing down, straining. No further syncopal episodes reported. She denies pain at present. Joann [name] states patient has been complaining of right hip/leg pain more frequently. She is also sitting with her leg externally rotated more frequently. She denies shortness of breath, nausea. She has a good appetite. She is sleeping well at night.   CODE STATUS: DNR  PPS: 40% HOSPICE ELIGIBILITY/DIAGNOSIS: TBD  PAST MEDICAL HISTORY:  Past Medical History:  Diagnosis Date  . Anxiety   . Anxiety disorder   . Arthritis   . Chicken pox   . Colon polyp   . GERD (gastroesophageal reflux disease)   . Hyperlipidemia   . Hypertension   . Macular degeneration    Dr. [facility] in [place]  . Thyroid disease     SOCIAL HX:  Social History   Tobacco Use  . Smoking status: Current Every Day Smoker    Packs/day: 0.50    Types: Cigarettes  . Smokeless tobacco: Never Used  Substance Use Topics  . Alcohol use: Yes    Comment: rarely    ALLERGIES:  Allergies  Allergen Reactions  .  Codeine Other (See Comments)    Paralysis      PERTINENT MEDICATIONS:  Outpatient Encounter Medications as of 02/23/2019  Medication Sig  . acetaminophen (TYLENOL) 325 MG tablet Take 650 mg by mouth 4 (four) times daily.  Marland Kitchen atorvastatin (LIPITOR) 10 MG tablet Take 10 mg by mouth daily.   . cephALEXin (KEFLEX) 500 MG capsule Take 1 capsule (500 mg total) by mouth 3 (three) times daily for 10 days.  Marland Kitchen donepezil (ARICEPT) 10 MG tablet Take 10 mg by mouth at bedtime.   . folic acid (FOLVITE) 1 MG tablet Take 1 mg by mouth daily.  Marland Kitchen levothyroxine (SYNTHROID, LEVOTHROID) 25 MCG tablet TAKE ONE TABLET BY MOUTH ONCE DAILY  . mirtazapine (REMERON) 7.5 MG tablet Take 7.5 mg by mouth at bedtime.   Marland Kitchen QUEtiapine (SEROQUEL) 50 MG tablet Take 100 mg by mouth at bedtime. 2 tabs  . sertraline (ZOLOFT) 50 MG tablet TAKE ONE TABLET BY MOUTH ONCE DAILY *FOLLOW  UP  WITH  PCP  FOR  MORE  REFILLS*  . traMADol (ULTRAM) 50 MG tablet Take 50 mg by mouth 2 (two) times daily. pain- scheduled. Hold for sedation   . zinc gluconate 50 MG tablet Take 50 mg by mouth daily.  . Amino Acids-Protein Hydrolys (FEEDING SUPPLEMENT, PRO-STAT SUGAR FREE 64,) LIQD Take 30 mLs by mouth 2 (two) times daily between meals. low protein, low albumin  . cephALEXin (KEFLEX) 500 MG capsule Take 1 capsule (500 mg total) by mouth every 12 (twelve) hours.  . Cholecalciferol 4000 units CAPS Take 1 capsule by mouth daily.  . diazepam (VALIUM) 2 MG tablet Take 1 tablet (2 mg total) by mouth every 8 (eight) hours as needed for muscle spasms (unrelieved with Robaxin). (Patient not taking: Reported on 02/23/2019)  . Lidocaine (ASPERCREME LIDOCAINE) 4 % PTCH Apply 1 patch topically daily. Apply to right hip daily for pain. Remove after 12 hours  . methocarbamol (ROBAXIN) 500 MG tablet Take 1 tablet (500 mg total) by mouth every 6 (six) hours as needed for muscle spasms. (Patient not taking: Reported on 02/23/2019)  . Nutritional Supplements (ENSURE ENLIVE PO) Take 1 Bottle by mouth 2 (two) times daily between meals.  Marland Kitchen oxyCODONE (OXY IR/ROXICODONE) 5 MG immediate release tablet Take 1 tablet (5 mg total) by mouth every 4 (four) hours as needed for moderate pain. (Patient not taking: Reported on 02/23/2019)  . Probiotic Product (RISA-BID  PROBIOTIC) TABS Take 1 tablet by mouth 2 (two) times daily.   No facility-administered encounter medications on file as of 02/23/2019.     PHYSICAL EXAM:   General: NAD Cardiovascular: regular rate and rhythm Pulmonary: clear ant fields Abdomen: soft, nontender, + bowel sounds GU: no suprapubic tenderness Extremities: no edema, right leg slightly externally rotated Skin: no rashes Neurological: Weakness but otherwise nonfocal  Ezekiel Slocumb, NP

## 2019-03-21 DIAGNOSIS — F0391 Unspecified dementia with behavioral disturbance: Secondary | ICD-10-CM | POA: Diagnosis not present

## 2019-04-05 IMAGING — CR DG KNEE COMPLETE 4+V*R*
1 series · 5 of 5 positions shown · non-contrast
Comparison: None.

CLINICAL DATA: Fall, unable to bear weight

EXAM:
RIGHT KNEE - COMPLETE 4+ VIEW

[Series 1: dg knee complete 4 views right · 0.14mm/px · 5 of 5 slices shown]
[im 1/5]
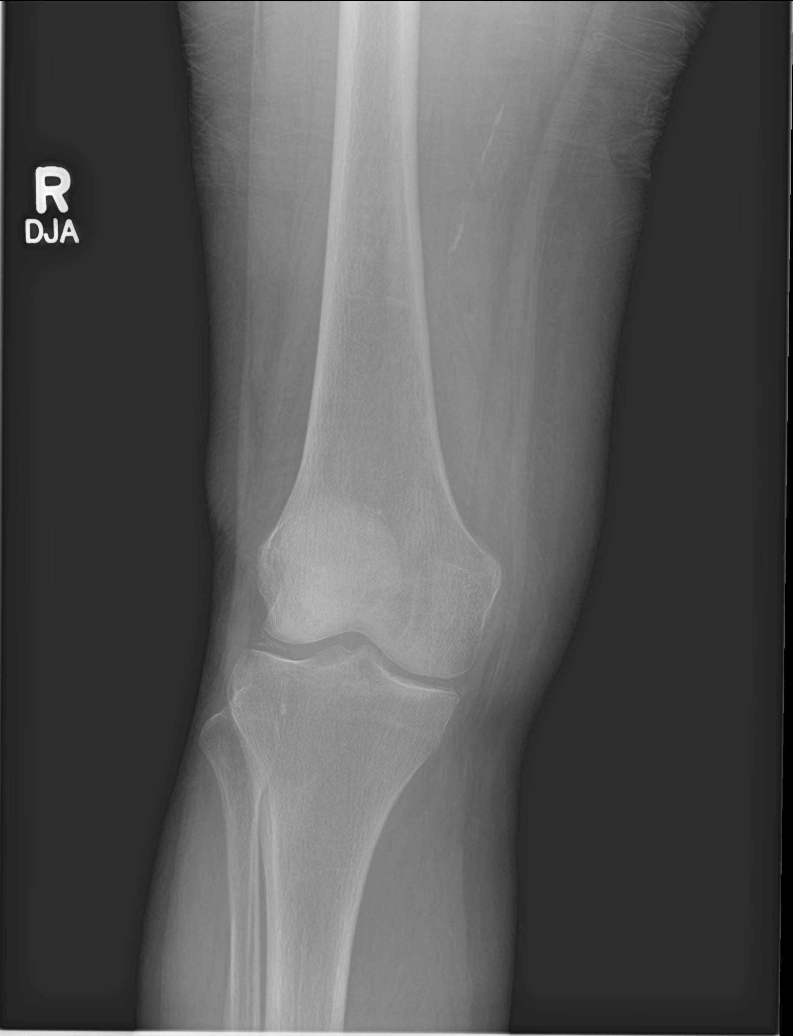
[im 2/5]
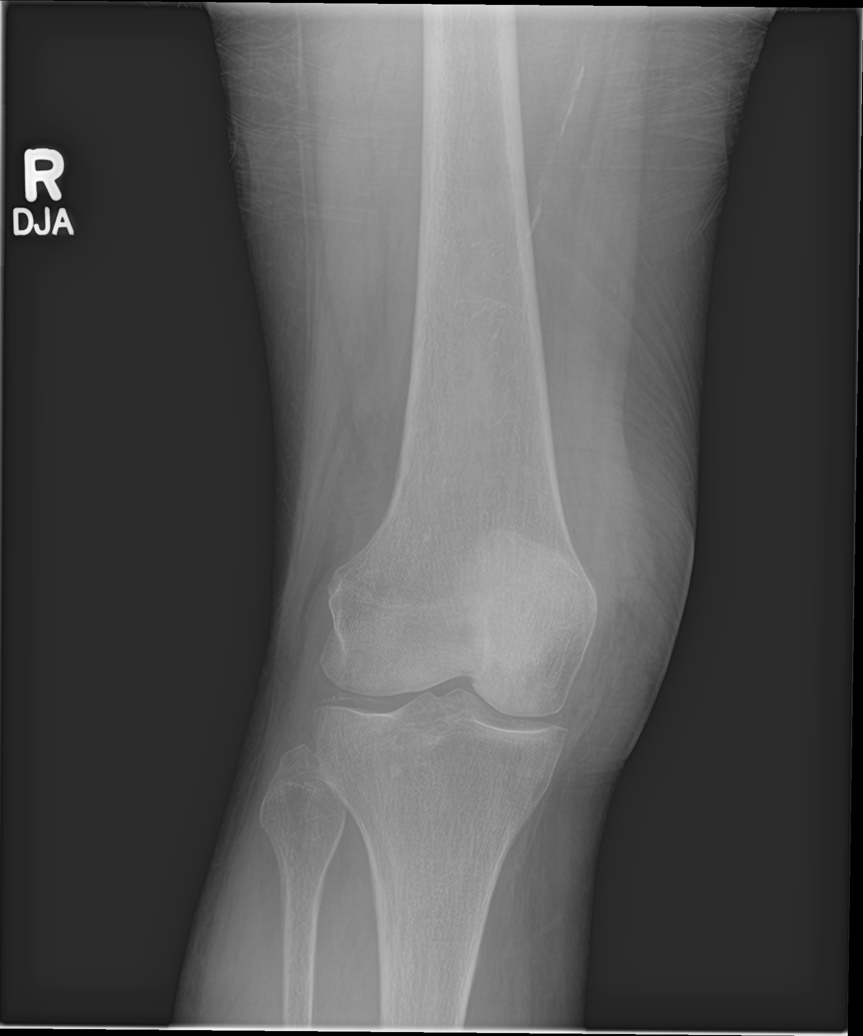
[im 3/5]
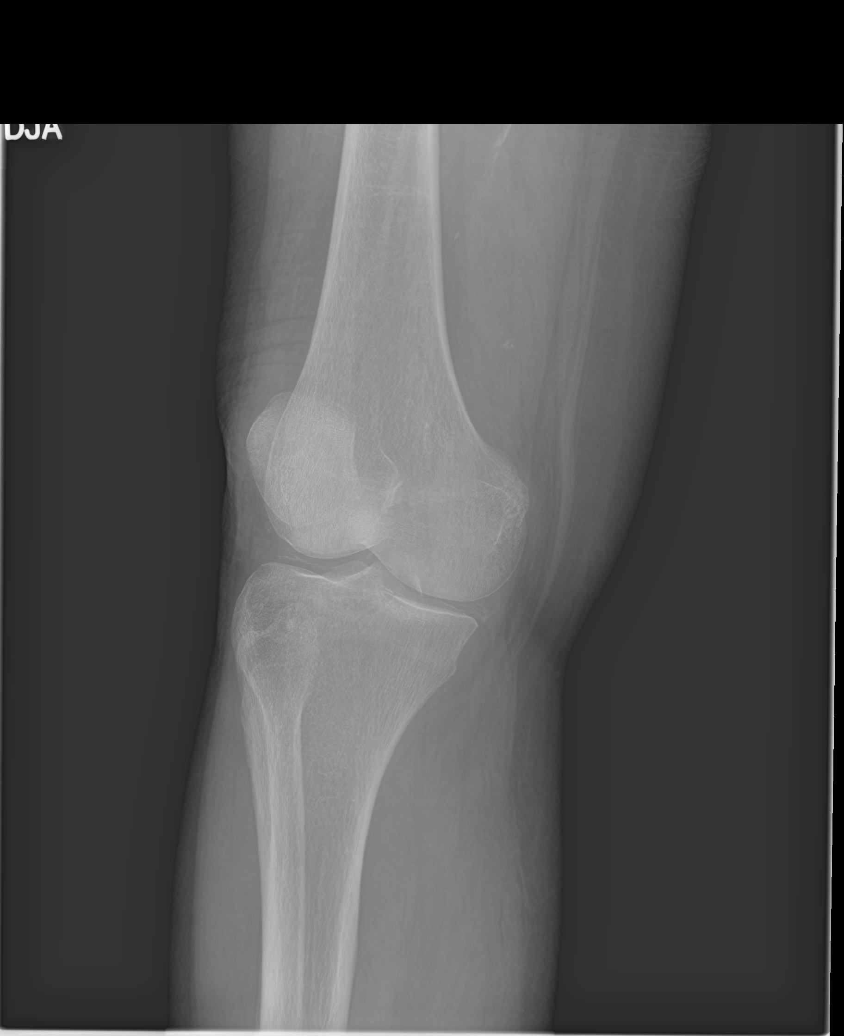
[im 4/5]
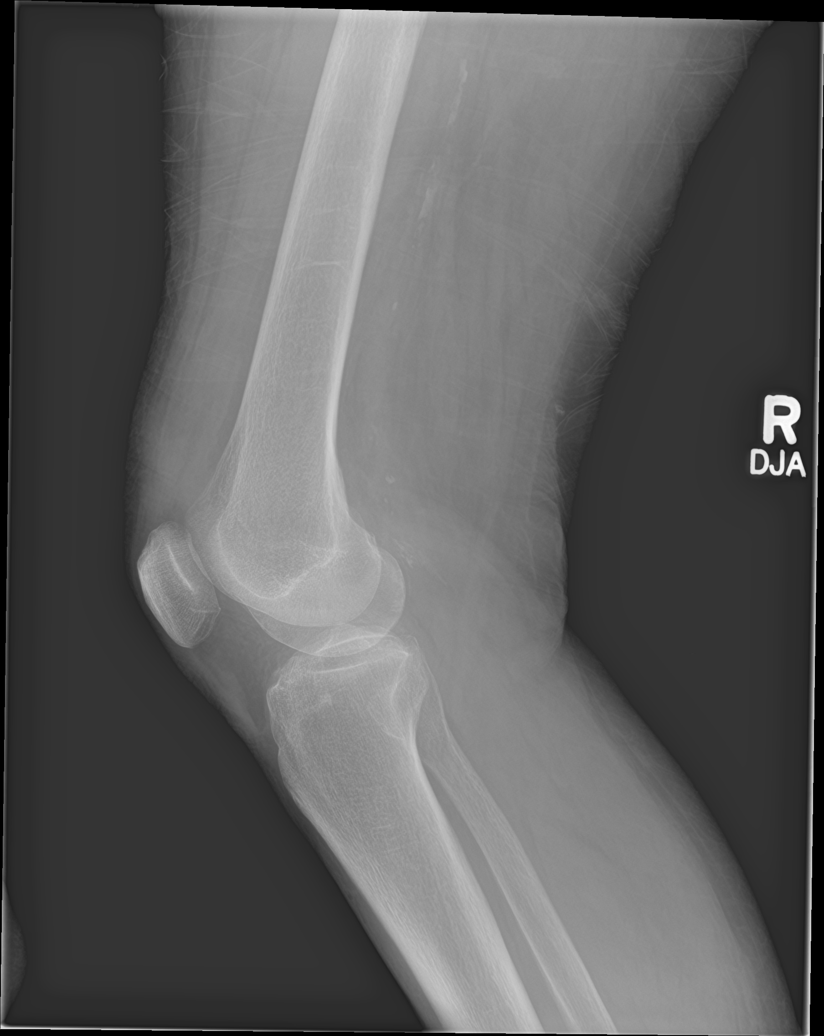
[im 5/5]
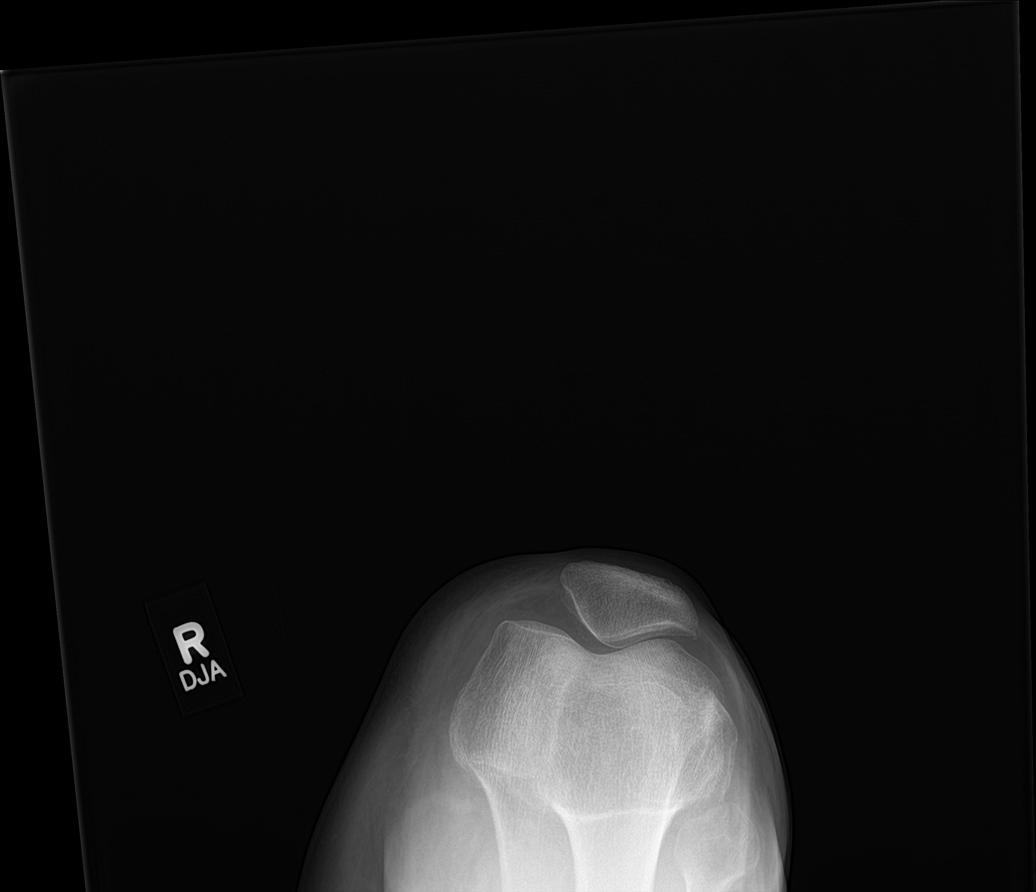

[5 of 5 positions shown; findings below may reference images not displayed]

FINDINGS: Minimal lateral deviation of the patella. Small suprapatellar
effusion. No fracture. Mild degenerative changes of the medial
compartment. Joint space calcifications. Vascular calcifications.
IMPRESSION: 1. No acute osseous abnormality
2. Chondrocalcinosis
3. Minimal lateral deviation of the patella.

## 2019-04-21 DIAGNOSIS — F0391 Unspecified dementia with behavioral disturbance: Secondary | ICD-10-CM | POA: Diagnosis not present

## 2019-05-21 DIAGNOSIS — F0391 Unspecified dementia with behavioral disturbance: Secondary | ICD-10-CM | POA: Diagnosis not present

## 2019-05-25 ENCOUNTER — Telehealth: Payer: Self-pay | Admitting: Adult Health Nurse Practitioner

## 2019-05-25 NOTE — Telephone Encounter (Signed)
Called to schedule appointment.  Left VM with contact info.   Buena Boehm K. Dreyden Rohrman NP 

## 2019-06-28 ENCOUNTER — Telehealth: Payer: Self-pay | Admitting: Adult Health Nurse Practitioner

## 2019-06-28 NOTE — Telephone Encounter (Signed)
Returned daughter's call to set up visit. Scheduled visit for 07/18/2019 at [place] her my contact info if anything changes Harris Kistler K. Olena Heckle NP

## 2019-07-18 ENCOUNTER — Other Ambulatory Visit: Payer: PPO | Admitting: Adult Health Nurse Practitioner

## 2019-07-18 ENCOUNTER — Other Ambulatory Visit: Payer: Self-pay

## 2019-07-18 DIAGNOSIS — Z515 Encounter for palliative care: Secondary | ICD-10-CM

## 2019-07-18 DIAGNOSIS — F0391 Unspecified dementia with behavioral disturbance: Secondary | ICD-10-CM | POA: Diagnosis not present

## 2019-07-18 NOTE — Progress Notes (Signed)
[facility] Consult Note Telephone: [phone]  Fax: [phone]  PATIENT NAME: Joann [name] DOB: 1933-06-22 MRN: [id number]  PRIMARY CARE PROVIDER:   Marinda Elk, MD  REFERRING PROVIDER:  Marinda Elk, MD [place],  [place] [address]  RESPONSIBLE PARTY:   Ilene Qua, daughter [phone]    RECOMMENDATIONS and PLAN:  1. Advanced care planning.  Patient is a DNR.    2. Dementia. Patient is able to walk with walker with stand by assist.  Caregiver encourages her to walk and do leg exercises to keep her joints flexible.  Her right leg does slightly externally rotate;caregiver uses pillows for positioning while sitting.She requires assistance with ADLs and is able to feed herself.  Appetite has been good with no weight changes reported.  Patient is A&O to person and place.  She is able to answer questions appropriately.  She repeats herself during conversation. Continue falls precautions and positioning with pillows for right leg.    3.  Pain.  Caregiver states that she will complain of pain in right hip at times; especially when standing on it.  Gets relief with Tylenol and occasionally needs Tramadol.  Continue current pain management.    Overall patient is doing well with no significant changes.Caregiver does state she will occasionally use pursed lip breathing.  O2 is 98% on RA and lung fields clear with normal respiratory effort. Denies increased SOB, cough, fever, N/VD, constipation.Continue to monitor for worsening symptoms. No reported falls, infections, or hospitalizations since last visit.  Called and left VM for daughter with update on visit.  Encouraged to call with questions or concerns.  Palliative care will continue to monitor for decline and symptom management and make recommendations as needed.  I spent 30 minutes providing this consultation,  from 12:50 to 1:20. More  than 50% of the time in this consultation was spent coordinating communication.   HISTORY OF PRESENT ILLNESS:  Joann [name] is a 85 y.o. year old female with multiple medical problems including dementia, abnormal weight loss, anxiety, hypertension, hyperlipidemia, osteoarthritis, GERD, macular degeneration, hypothyroidism, hx of right hip fracture with ORIF. Palliative Care was asked to help address goals of care.   CODE STATUS: DNR  PPS: 40% HOSPICE ELIGIBILITY/DIAGNOSIS: TBD  PHYSICAL EXAM:  HR 79 O2 98% on RA General: NAD, frail appearing Cardiovascular: regular rate and rhythm Pulmonary: lung sounds clear; normal respiratory effort Extremities: no edema, no joint deformities Skin: no rashes Neurological: Weakness; A&O to person and place   PAST MEDICAL HISTORY:  Past Medical History:  Diagnosis Date  . Anxiety   . Anxiety disorder   . Arthritis   . Chicken pox   . Colon polyp   . GERD (gastroesophageal reflux disease)   . Hyperlipidemia   . Hypertension   . Macular degeneration    Dr. [facility] in [place]  . Thyroid disease     SOCIAL HX:  Social History   Tobacco Use  . Smoking status: Current Every Day Smoker    Packs/day: 0.50    Types: Cigarettes  . Smokeless tobacco: Never Used  Substance Use Topics  . Alcohol use: Yes    Comment: rarely    ALLERGIES:  Allergies  Allergen Reactions  . Codeine Other (See Comments)    Paralysis      PERTINENT MEDICATIONS:  Outpatient Encounter Medications as of 07/18/2019  Medication Sig  . acetaminophen (TYLENOL) 500 MG tablet Take 500  mg by mouth every 6 (six) hours as needed.   . Amino Acids-Protein Hydrolys (FEEDING SUPPLEMENT, PRO-STAT SUGAR FREE 64,) LIQD Take 30 mLs by mouth 2 (two) times daily between meals. low protein, low albumin  . atorvastatin (LIPITOR) 10 MG tablet Take 10 mg by mouth daily.  . Cholecalciferol 4000 units CAPS Take 1 capsule by mouth daily.  . diazepam (VALIUM) 2 MG tablet Take 1  tablet (2 mg total) by mouth every 8 (eight) hours as needed for muscle spasms (unrelieved with Robaxin). (Patient not taking: Reported on 02/23/2019)  . donepezil (ARICEPT) 10 MG tablet Take 10 mg by mouth at bedtime.   . folic acid (FOLVITE) 1 MG tablet Take 1 mg by mouth daily.  Marland Kitchen levothyroxine (SYNTHROID, LEVOTHROID) 25 MCG tablet TAKE ONE TABLET BY MOUTH ONCE DAILY  . Lidocaine (ASPERCREME LIDOCAINE) 4 % PTCH Apply 1 patch topically daily. Apply to right hip daily for pain. Remove after 12 hours  . methocarbamol (ROBAXIN) 500 MG tablet Take 1 tablet (500 mg total) by mouth every 6 (six) hours as needed for muscle spasms. (Patient not taking: Reported on 02/23/2019)  . mirtazapine (REMERON) 7.5 MG tablet Take 7.5 mg by mouth at bedtime.   . Nutritional Supplements (ENSURE ENLIVE PO) Take 1 Bottle by mouth 2 (two) times daily between meals.  Marland Kitchen oxyCODONE (OXY IR/ROXICODONE) 5 MG immediate release tablet Take 1 tablet (5 mg total) by mouth every 4 (four) hours as needed for moderate pain. (Patient not taking: Reported on 02/23/2019)  . Probiotic Product (RISA-BID PROBIOTIC) TABS Take 1 tablet by mouth 2 (two) times daily.  . QUEtiapine (SEROQUEL) 50 MG tablet Take 100 mg by mouth at bedtime. 2 tabs  . sertraline (ZOLOFT) 50 MG tablet TAKE ONE TABLET BY MOUTH ONCE DAILY *FOLLOW  UP  WITH  PCP  FOR  MORE  REFILLS*  . traMADol (ULTRAM) 50 MG tablet Take 50 mg by mouth 2 (two) times daily. pain- scheduled. Hold for sedation   . zinc gluconate 50 MG tablet Take 50 mg by mouth 2 (two) times daily as needed.    No facility-administered encounter medications on file as of 07/18/2019.      Amy Marlena Clipper, NP

## 2019-07-19 DIAGNOSIS — E782 Mixed hyperlipidemia: Secondary | ICD-10-CM | POA: Diagnosis not present

## 2019-07-19 DIAGNOSIS — I10 Essential (primary) hypertension: Secondary | ICD-10-CM | POA: Diagnosis not present

## 2019-07-19 DIAGNOSIS — R441 Visual hallucinations: Secondary | ICD-10-CM | POA: Diagnosis not present

## 2019-07-19 DIAGNOSIS — R1310 Dysphagia, unspecified: Secondary | ICD-10-CM | POA: Diagnosis not present

## 2019-07-19 DIAGNOSIS — F0281 Dementia in other diseases classified elsewhere with behavioral disturbance: Secondary | ICD-10-CM | POA: Diagnosis not present

## 2019-07-19 DIAGNOSIS — M8000XD Age-related osteoporosis with current pathological fracture, unspecified site, subsequent encounter for fracture with routine healing: Secondary | ICD-10-CM | POA: Diagnosis not present

## 2019-07-19 DIAGNOSIS — E039 Hypothyroidism, unspecified: Secondary | ICD-10-CM | POA: Diagnosis not present

## 2019-07-19 DIAGNOSIS — Z7409 Other reduced mobility: Secondary | ICD-10-CM | POA: Diagnosis not present

## 2019-07-19 DIAGNOSIS — G301 Alzheimer's disease with late onset: Secondary | ICD-10-CM | POA: Diagnosis not present

## 2019-07-25 DIAGNOSIS — R8290 Unspecified abnormal findings in urine: Secondary | ICD-10-CM | POA: Diagnosis not present

## 2019-07-25 DIAGNOSIS — R300 Dysuria: Secondary | ICD-10-CM | POA: Diagnosis not present

## 2019-12-19 ENCOUNTER — Telehealth: Payer: Self-pay | Admitting: Adult Health Nurse Practitioner

## 2019-12-19 NOTE — Telephone Encounter (Signed)
Called daughter, [name], to set up appointment.  Scheduled appointment for 12/21/19 at 3pm. Jameia Makris K. Garner Nash NP

## 2019-12-21 ENCOUNTER — Other Ambulatory Visit: Payer: Self-pay

## 2019-12-21 ENCOUNTER — Other Ambulatory Visit: Payer: PPO | Admitting: Adult Health Nurse Practitioner

## 2019-12-21 DIAGNOSIS — F0391 Unspecified dementia with behavioral disturbance: Secondary | ICD-10-CM

## 2019-12-21 DIAGNOSIS — Z515 Encounter for palliative care: Secondary | ICD-10-CM | POA: Diagnosis not present

## 2019-12-21 NOTE — Progress Notes (Signed)
[facility] Consult Note Telephone: [phone]  Fax: [phone]  PATIENT NAME: Joann [name] DOB: [birth year] MRN: [id number]  PRIMARY CARE PROVIDER:   Marinda Elk, MD  REFERRING PROVIDER:  Marinda Elk, MD [address] [place],  [place] [id number]  RESPONSIBLE PARTY:   Ilene Qua, daughter [phone]       RECOMMENDATIONS and PLAN:  1.  Advanced care planning.  Patient is a DNR.   2.  Dementia. Patient pretty much unchanged from last visit.  Patient is able to walk with walker with stand by assist.  Caregiver encourages her to walk and do leg exercises to keep her joints flexible.  Her right leg does slightly externally rotate;caregiver uses pillows for positioning while sitting.She requires assistance with ADLs and is able to feed herself.  Appetite has been good with no weight changes reported.  Patient is A&O to person and place.  She is able to answer questions appropriately.  She repeats herself during conversation. Continue falls precautions and positioning with pillows for right leg.    3.  Pain.  She still gets occasional pain in right hip which is relieved with Tylenol.  Continue Tylenol as needed for pain  4.  Allergies.  Caregiver and daughter state that last week she had wheezing and watery eyes and cough.  She was given allergy medicine and robitussin and is feeling much better this week.  Denies fever, cough, congestion, wheezing, SOB today.  Continue OTC allergy medicine as needed for allergy symptoms.  Overall patient is doing well with no significant changes.  No reported falls, infections, or hospitalizations since last visit. Encouraged to call with questions or concerns.  Palliative care will continue to monitor for decline and symptom management and make recommendations as needed.  Will call in 6-8 weeks for follow up.  I spent 50 minutes providing this consultation,   from 3:00 to 3:50 including time spent with patient/family, chart review, provider coordination, documentation. More than 50% of the time in this consultation was spent coordinating communication.   HISTORY OF PRESENT ILLNESS:  Joann [name] is a 86 y.o. year old female with multiple medical problems including dementia, abnormal weight loss, anxiety, hypertension, hyperlipidemia, osteoarthritis, GERD, macular degeneration, hypothyroidism, hx of right hip fracture with ORIF. Palliative Care was asked to help address goals of care.   CODE STATUS: DNR  PPS: 40% HOSPICE ELIGIBILITY/DIAGNOSIS: TBD  PHYSICAL EXAM:  BP 130/62  HR 71 O2 97% on RA General: NAD, frail appearing Cardiovascular: regular rate and rhythm Pulmonary: lung sounds clear; normal respiratory effort Extremities: no edema, no joint deformities Skin: no rashes Neurological: Weakness; A&O to person and place  PAST MEDICAL HISTORY:  Past Medical History:  Diagnosis Date  . Anxiety   . Anxiety disorder   . Arthritis   . Chicken pox   . Colon polyp   . GERD (gastroesophageal reflux disease)   . Hyperlipidemia   . Hypertension   . Macular degeneration    Dr. [facility] in [place]  . Thyroid disease     SOCIAL HX:  Social History   Tobacco Use  . Smoking status: Current Every Day Smoker    Packs/day: 0.50    Types: Cigarettes  . Smokeless tobacco: Never Used  Substance Use Topics  . Alcohol use: Yes    Comment: rarely    ALLERGIES:  Allergies  Allergen Reactions  . Codeine Other (See Comments)    Paralysis  PERTINENT MEDICATIONS:  Outpatient Encounter Medications as of 12/21/2019  Medication Sig  . acetaminophen (TYLENOL) 500 MG tablet Take 500 mg by mouth every 6 (six) hours as needed.   . Amino Acids-Protein Hydrolys (FEEDING SUPPLEMENT, PRO-STAT SUGAR FREE 64,) LIQD Take 30 mLs by mouth 2 (two) times daily between meals. low protein, low albumin  . atorvastatin (LIPITOR) 10 MG tablet Take 10 mg  by mouth daily.  . Cholecalciferol 4000 units CAPS Take 1 capsule by mouth daily.  . diazepam (VALIUM) 2 MG tablet Take 1 tablet (2 mg total) by mouth every 8 (eight) hours as needed for muscle spasms (unrelieved with Robaxin). (Patient not taking: Reported on 02/23/2019)  . donepezil (ARICEPT) 10 MG tablet Take 10 mg by mouth at bedtime.   . folic acid (FOLVITE) 1 MG tablet Take 1 mg by mouth daily.  Marland Kitchen levothyroxine (SYNTHROID, LEVOTHROID) 25 MCG tablet TAKE ONE TABLET BY MOUTH ONCE DAILY  . Lidocaine (ASPERCREME LIDOCAINE) 4 % PTCH Apply 1 patch topically daily. Apply to right hip daily for pain. Remove after 12 hours  . methocarbamol (ROBAXIN) 500 MG tablet Take 1 tablet (500 mg total) by mouth every 6 (six) hours as needed for muscle spasms. (Patient not taking: Reported on 02/23/2019)  . mirtazapine (REMERON) 7.5 MG tablet Take 7.5 mg by mouth at bedtime.   . Nutritional Supplements (ENSURE ENLIVE PO) Take 1 Bottle by mouth 2 (two) times daily between meals.  Marland Kitchen oxyCODONE (OXY IR/ROXICODONE) 5 MG immediate release tablet Take 1 tablet (5 mg total) by mouth every 4 (four) hours as needed for moderate pain. (Patient not taking: Reported on 02/23/2019)  . Probiotic Product (RISA-BID PROBIOTIC) TABS Take 1 tablet by mouth 2 (two) times daily.  . QUEtiapine (SEROQUEL) 50 MG tablet Take 100 mg by mouth at bedtime. 2 tabs  . sertraline (ZOLOFT) 50 MG tablet TAKE ONE TABLET BY MOUTH ONCE DAILY *FOLLOW  UP  WITH  PCP  FOR  MORE  REFILLS*  . traMADol (ULTRAM) 50 MG tablet Take 50 mg by mouth 2 (two) times daily. pain- scheduled. Hold for sedation   . zinc gluconate 50 MG tablet Take 50 mg by mouth 2 (two) times daily as needed.    No facility-administered encounter medications on file as of 12/21/2019.      Antavious Spanos Marlena Clipper, NP

## 2020-01-23 DIAGNOSIS — R300 Dysuria: Secondary | ICD-10-CM | POA: Diagnosis not present

## 2020-03-28 ENCOUNTER — Telehealth: Payer: Self-pay | Admitting: Adult Health Nurse Practitioner

## 2020-03-28 NOTE — Telephone Encounter (Signed)
Called to schedule appointment.  Left VM with reason for call and call back info Silena Wyss K. Kohei Antonellis NP 

## 2020-07-26 ENCOUNTER — Telehealth: Payer: Self-pay

## 2020-07-26 NOTE — Telephone Encounter (Signed)
Volunteer called patient on behalf of Palliative care and patient is doing well, no change.

## 2020-09-06 DIAGNOSIS — U071 COVID-19: Secondary | ICD-10-CM | POA: Diagnosis not present

## 2020-09-06 DIAGNOSIS — J208 Acute bronchitis due to other specified organisms: Secondary | ICD-10-CM | POA: Diagnosis not present

## 2020-10-01 ENCOUNTER — Telehealth: Payer: Self-pay

## 2020-10-01 NOTE — Telephone Encounter (Signed)
Volunteer called patient on behalf of Palliative Care and did not get a answer from patient/family. ° °

## 2020-12-24 ENCOUNTER — Telehealth: Payer: Self-pay

## 2020-12-24 NOTE — Telephone Encounter (Signed)
Volunteer called patient on behalf of [facility]. Patient is doing okay but does have a UTI currently.

## 2021-04-30 DIAGNOSIS — E782 Mixed hyperlipidemia: Secondary | ICD-10-CM | POA: Diagnosis not present

## 2021-04-30 DIAGNOSIS — F0281 Dementia in other diseases classified elsewhere with behavioral disturbance: Secondary | ICD-10-CM | POA: Diagnosis not present

## 2021-04-30 DIAGNOSIS — E039 Hypothyroidism, unspecified: Secondary | ICD-10-CM | POA: Diagnosis not present

## 2021-04-30 DIAGNOSIS — I10 Essential (primary) hypertension: Secondary | ICD-10-CM | POA: Diagnosis not present

## 2021-04-30 DIAGNOSIS — G301 Alzheimer's disease with late onset: Secondary | ICD-10-CM | POA: Diagnosis not present

## 2021-04-30 DIAGNOSIS — K5900 Constipation, unspecified: Secondary | ICD-10-CM | POA: Diagnosis not present

## 2021-04-30 DIAGNOSIS — Z0000 Encounter for general adult medical examination without abnormal findings: Secondary | ICD-10-CM | POA: Diagnosis not present

## 2021-05-01 IMAGING — CT CT HEAD WITHOUT CONTRAST
4 of 7 series · 15 of 47 positions shown, 16 images · non-contrast
Comparison: Prior studies 10/17/2017.

CLINICAL DATA: Syncopal episode. Diaphoresis. History of dementia.

EXAM:
CT HEAD WITHOUT CONTRAST
CT CERVICAL SPINE WITHOUT CONTRAST
TECHNIQUE: Multidetector CT imaging of the head and cervical spine was
performed following the standard protocol without intravenous
contrast. Multiplanar CT image reconstructions of the cervical spine
were also generated.

[Series 2: head wo · axial · 0.41mm/px · z∈[+154,+199]mm · 2 of 28 slices shown, 3 images]
[im 10/28  brain]
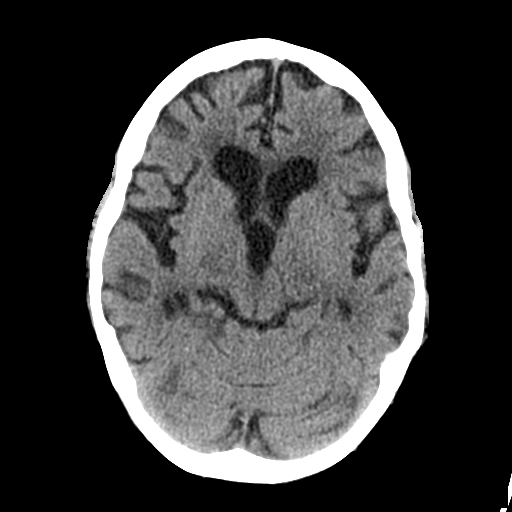
[im 10/28  bone]
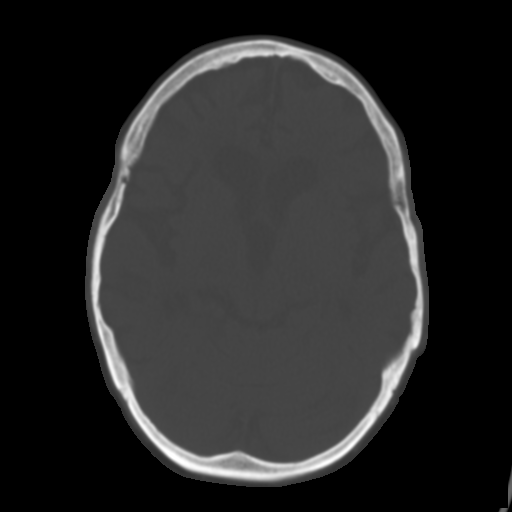
[im 19/28  brain]
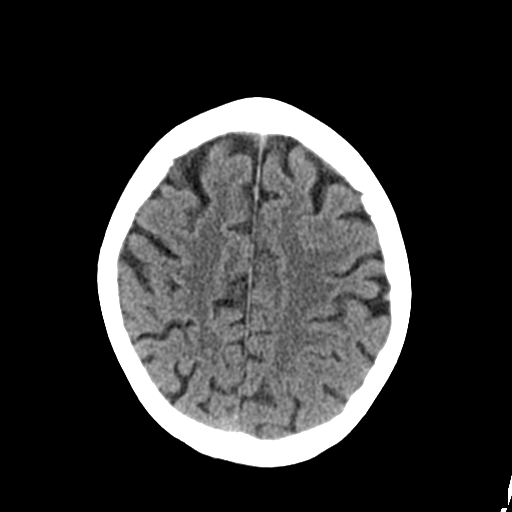

[Series 5: coronal soft tissue · coronal · 0.30mm/px · 3 of 63 slices shown]
[im 24/63  brain]
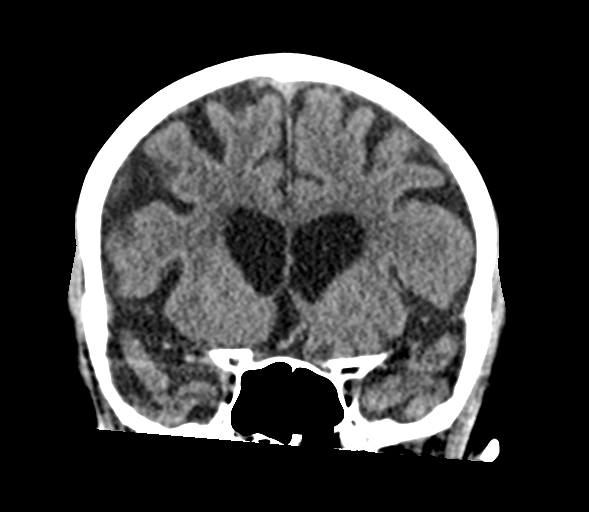
[im 32/63  brain]
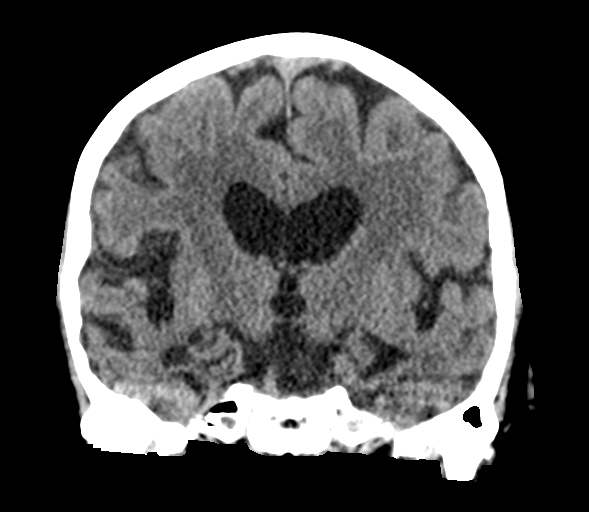
[im 39/63  brain]
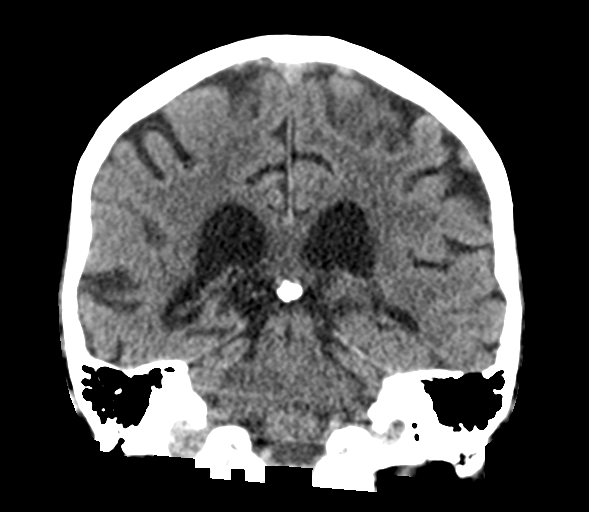

[Series 6: sagittal soft tissue · sagittal · 0.32mm/px · 2 of 51 slices shown]
[im 17/51  brain]
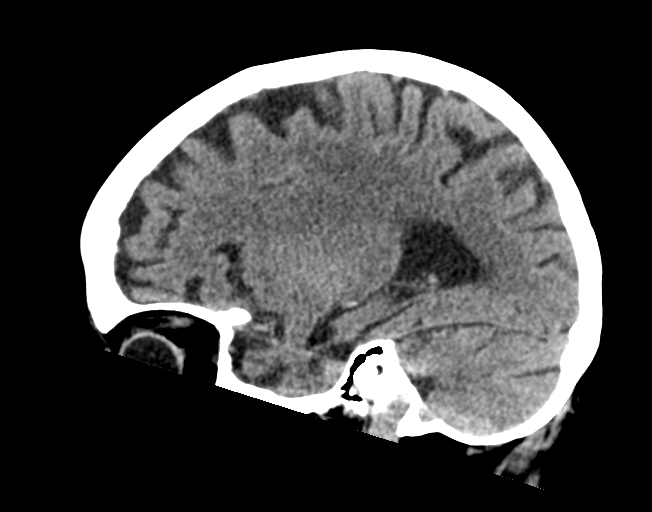
[im 34/51  brain]
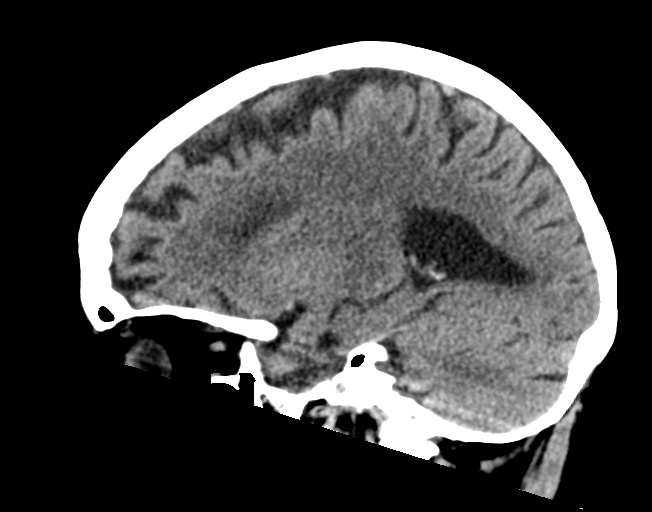

[Series 12: orthogonal bone · axial · 0.18mm/px · z∈[-61,+85]mm · 8 of 97 slices shown]
[im 8/97  bone]
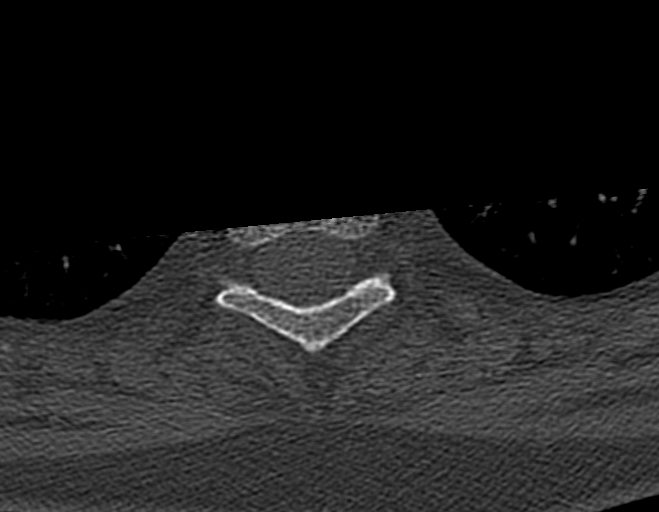
[im 23/97  bone]
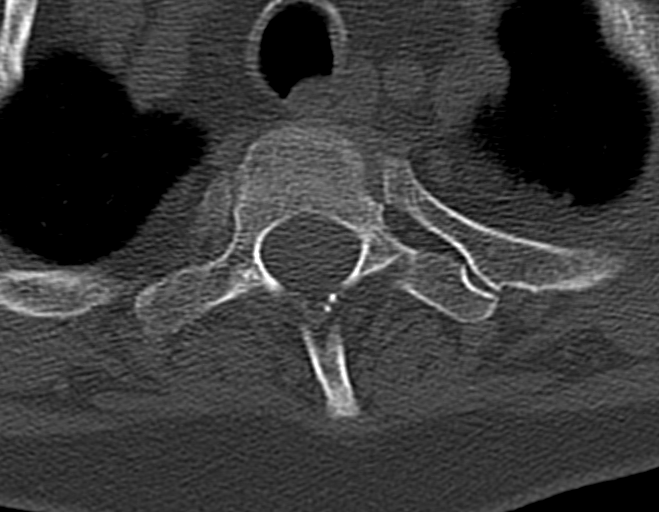
[im 30/97  bone]
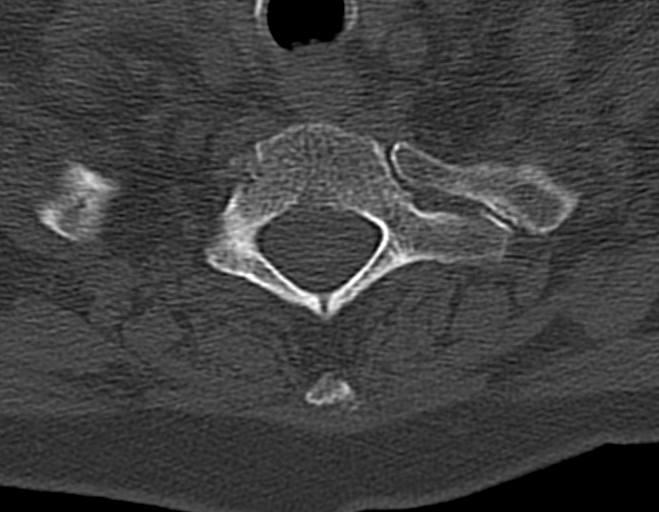
[im 45/97  bone]
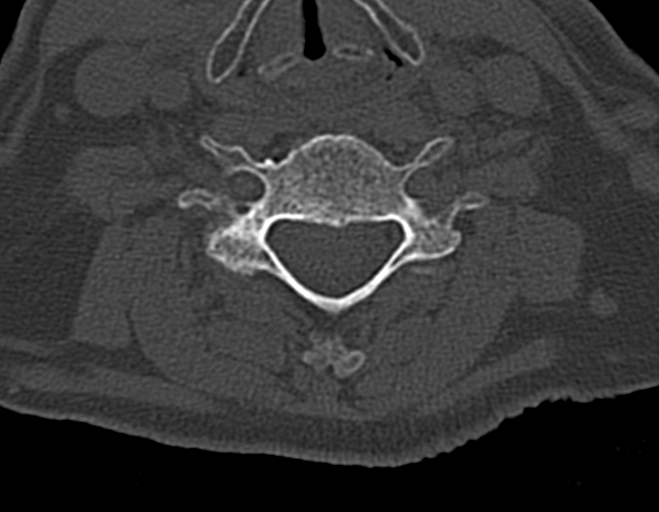
[im 52/97  bone]
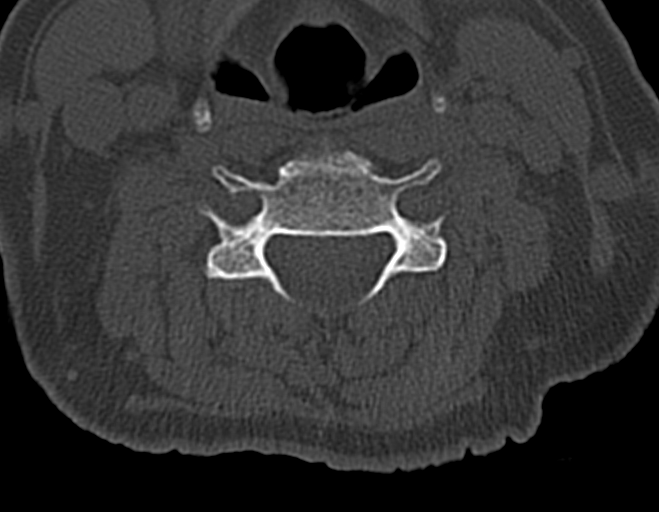
[im 67/97  bone]
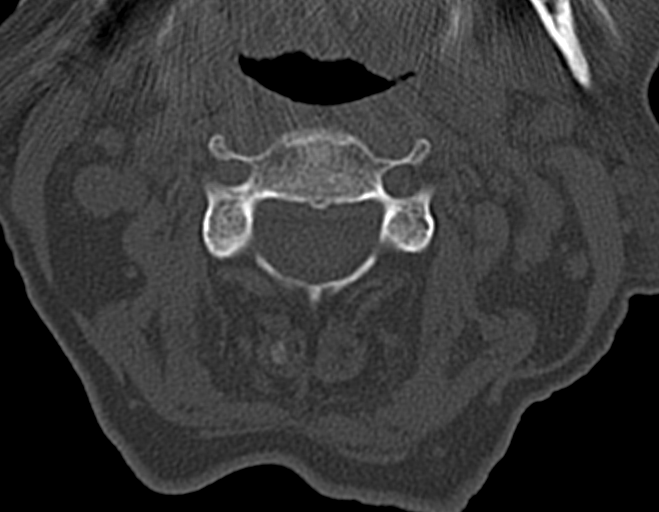
[im 74/97  bone]
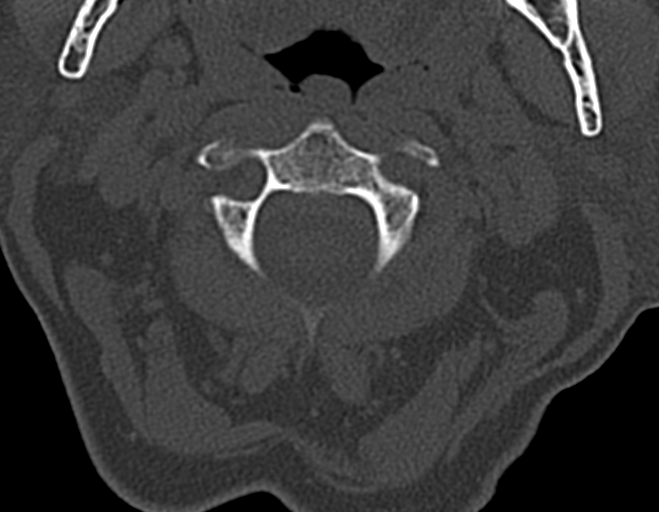
[im 89/97  bone]
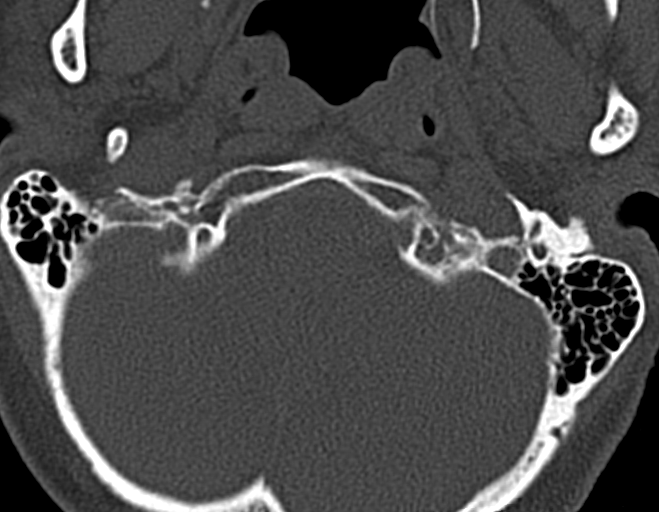

[15 of 47 positions shown; findings below may reference images not displayed]

FINDINGS: CT HEAD FINDINGS

Brain: There is no evidence of acute intracranial hemorrhage, mass
lesion, brain edema or extra-axial fluid collection. Stable atrophy
with prominence of the ventricles and subarachnoid spaces. There are
minimal chronic small vessel ischemic changes in the periventricular
white matter. There is no CT evidence of acute cortical infarction.

Vascular: Prominent intracranial vascular calcifications. No
hyperdense vessel identified.

Skull: Negative for fracture or focal lesion.

Sinuses/Orbits: The visualized paranasal sinuses and mastoid air
cells are clear. No orbital abnormalities are seen.

Other: None.

CT CERVICAL SPINE FINDINGS

Alignment: Stable with reversal of the usual cervical lordosis and a
slight anterolisthesis at C3-4 and C4-5.

Skull base and vertebrae: No evidence of acute fracture or traumatic
subluxation.

Soft tissues and spinal canal: No prevertebral fluid or swelling. No
visible canal hematoma.

Disc levels: Stable spondylosis with loss of disc height and
uncinate spurring greatest at C5-6 and C6-7. No large disc
herniation.

Upper chest: No significant findings.

Other: Bilateral carotid atherosclerosis.
IMPRESSION: 1. Stable head CT without acute or significant findings. Stable mild
atrophy.
2. No evidence of acute cervical spine fracture, traumatic
subluxation or static signs of instability.
3. Stable mild cervical spondylosis.

## 2021-05-01 IMAGING — CR DG HIP (WITH OR WITHOUT PELVIS) 2-3V LEFT
1 series · 3 of 3 positions shown · non-contrast
Comparison: None.

CLINICAL DATA: Left hip pain.

EXAM:
DG HIP (WITH OR WITHOUT PELVIS) 2-3V LEFT

[Series 1: dg hip unilat w or w/o pelvis 2-3 views  · non-contrast · 0.14mm/px · 3 of 3 slices shown]
[im 1/3]
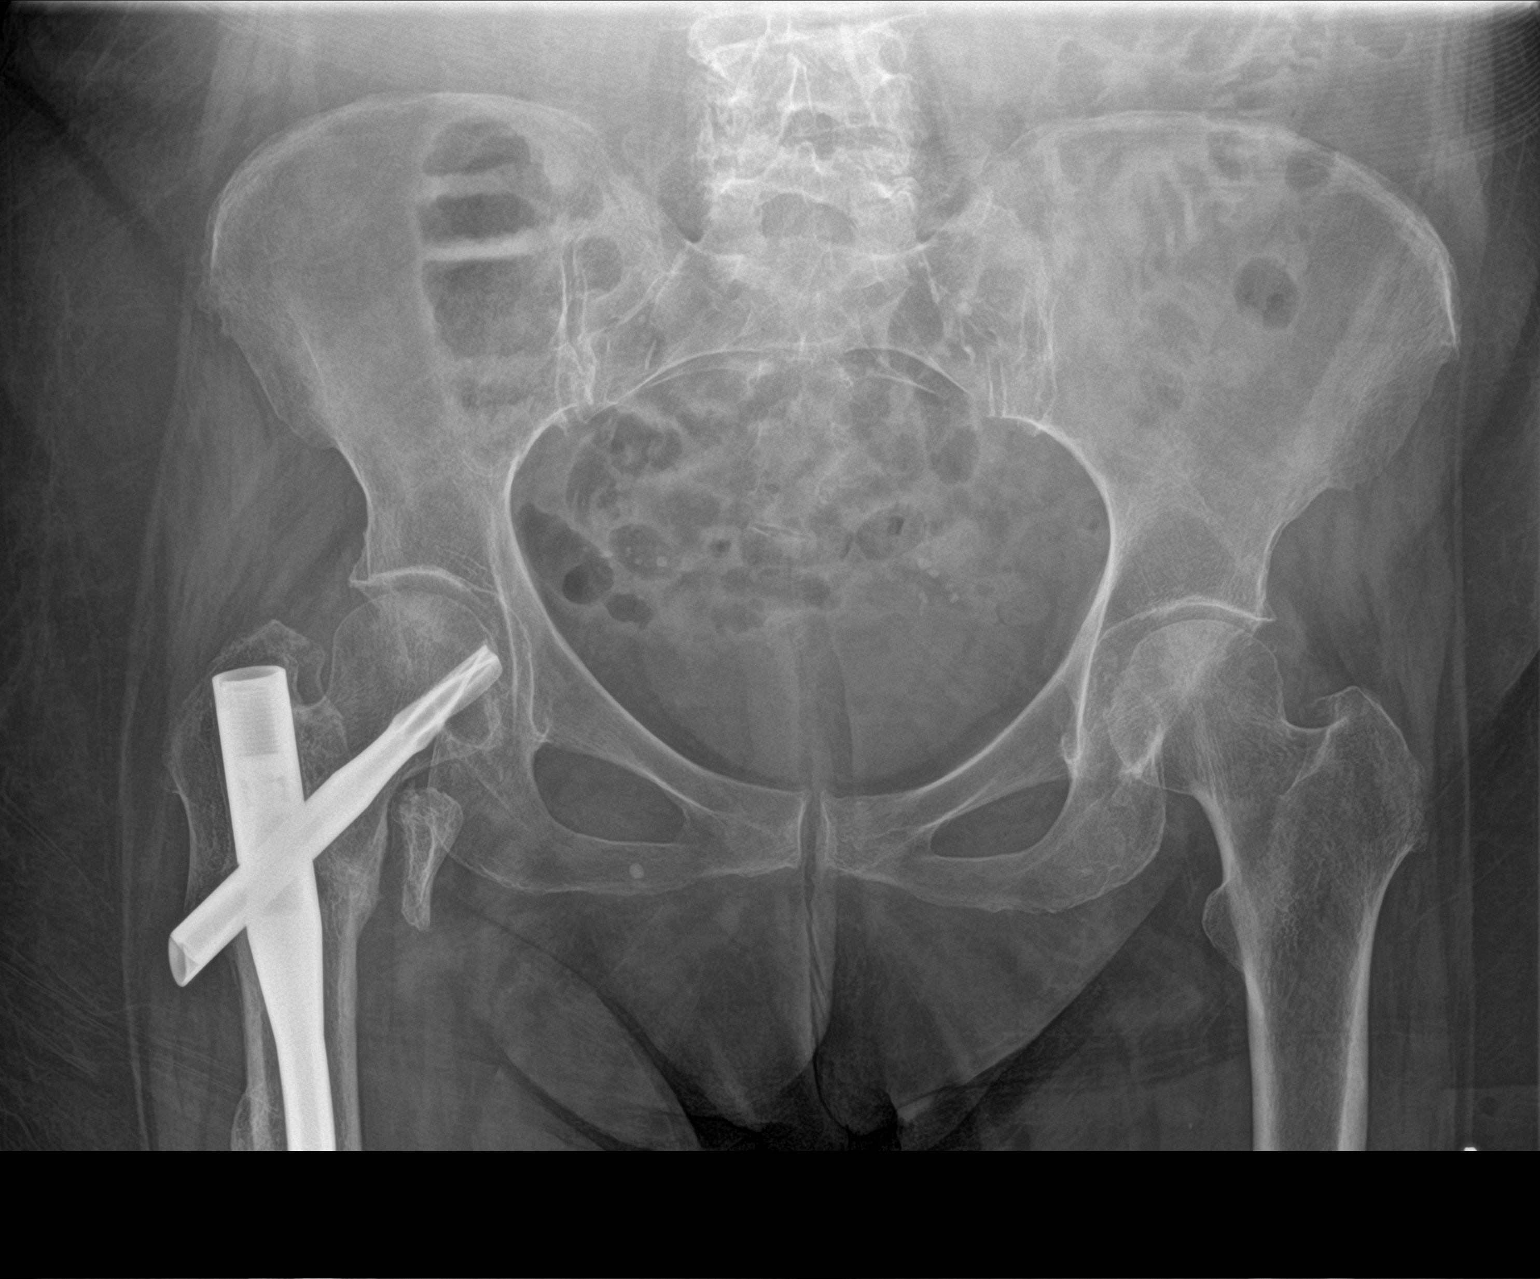
[im 2/3]
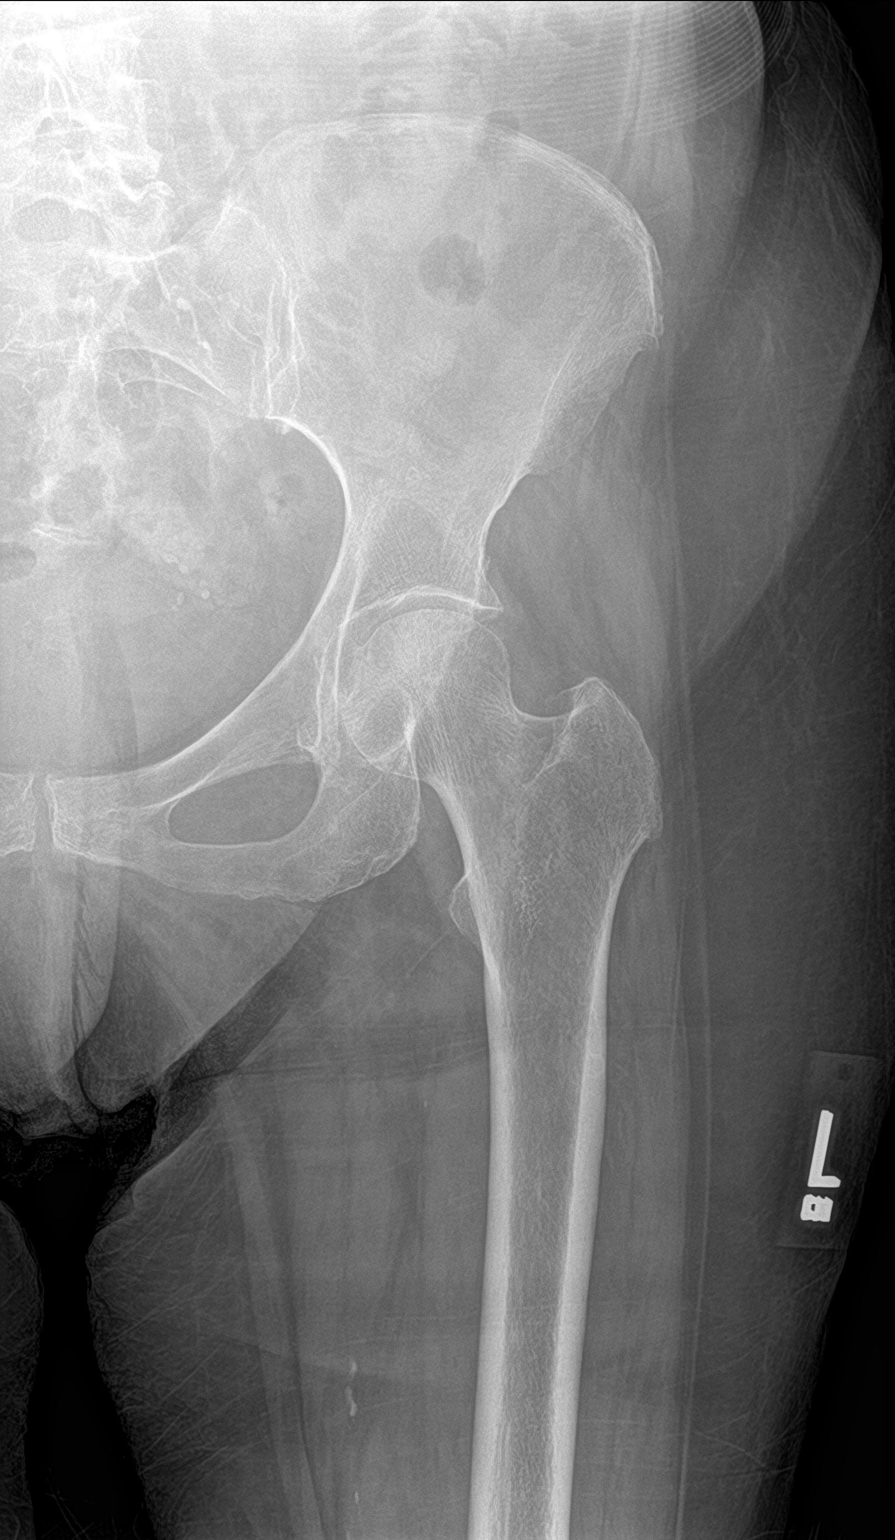
[im 3/3]
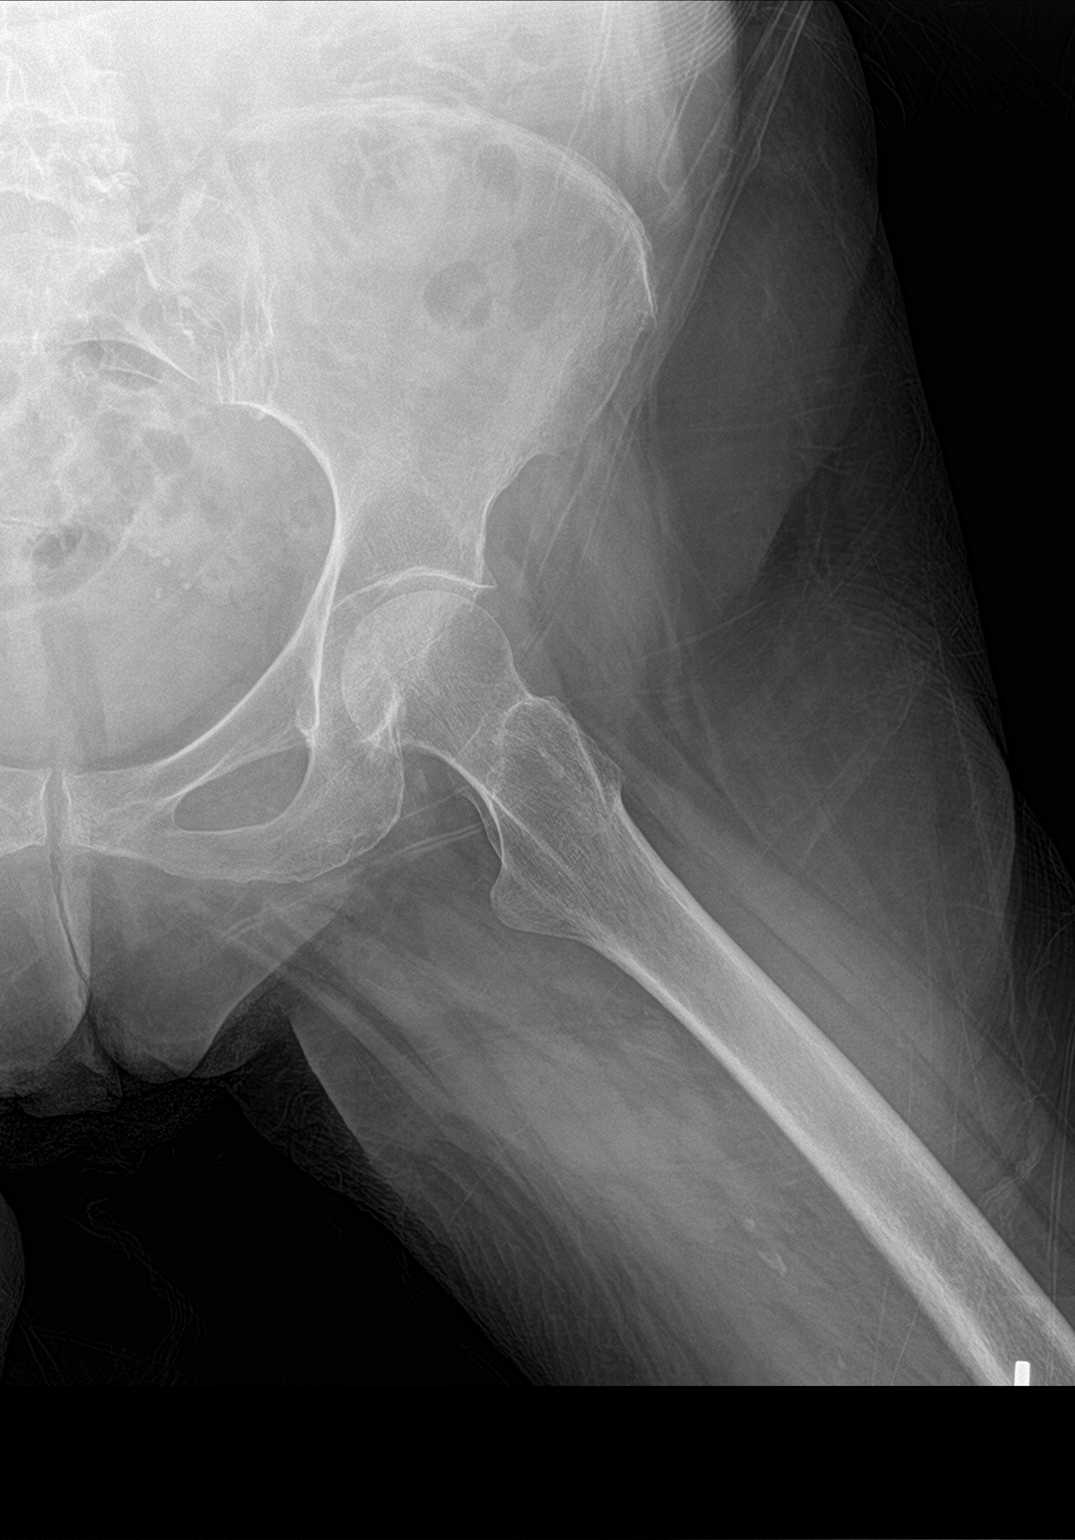

[3 of 3 positions shown; findings below may reference images not displayed]

FINDINGS: There is no evidence of hip fracture or dislocation. Mild narrowed
joint space with osteophyte formation is identified in the left hip.
Prior fixation of right femur is noted.
IMPRESSION: No acute fracture dislocation identified.

## 2021-05-01 IMAGING — DX PORTABLE CHEST - 1 VIEW
1 series · 1 of 1 positions shown · non-contrast
Comparison: Chest x-rays dated 12/27/2017 and 10/17/2017.

CLINICAL DATA: Syncopal episode

EXAM:
PORTABLE CHEST 1 VIEW

[chest ap]
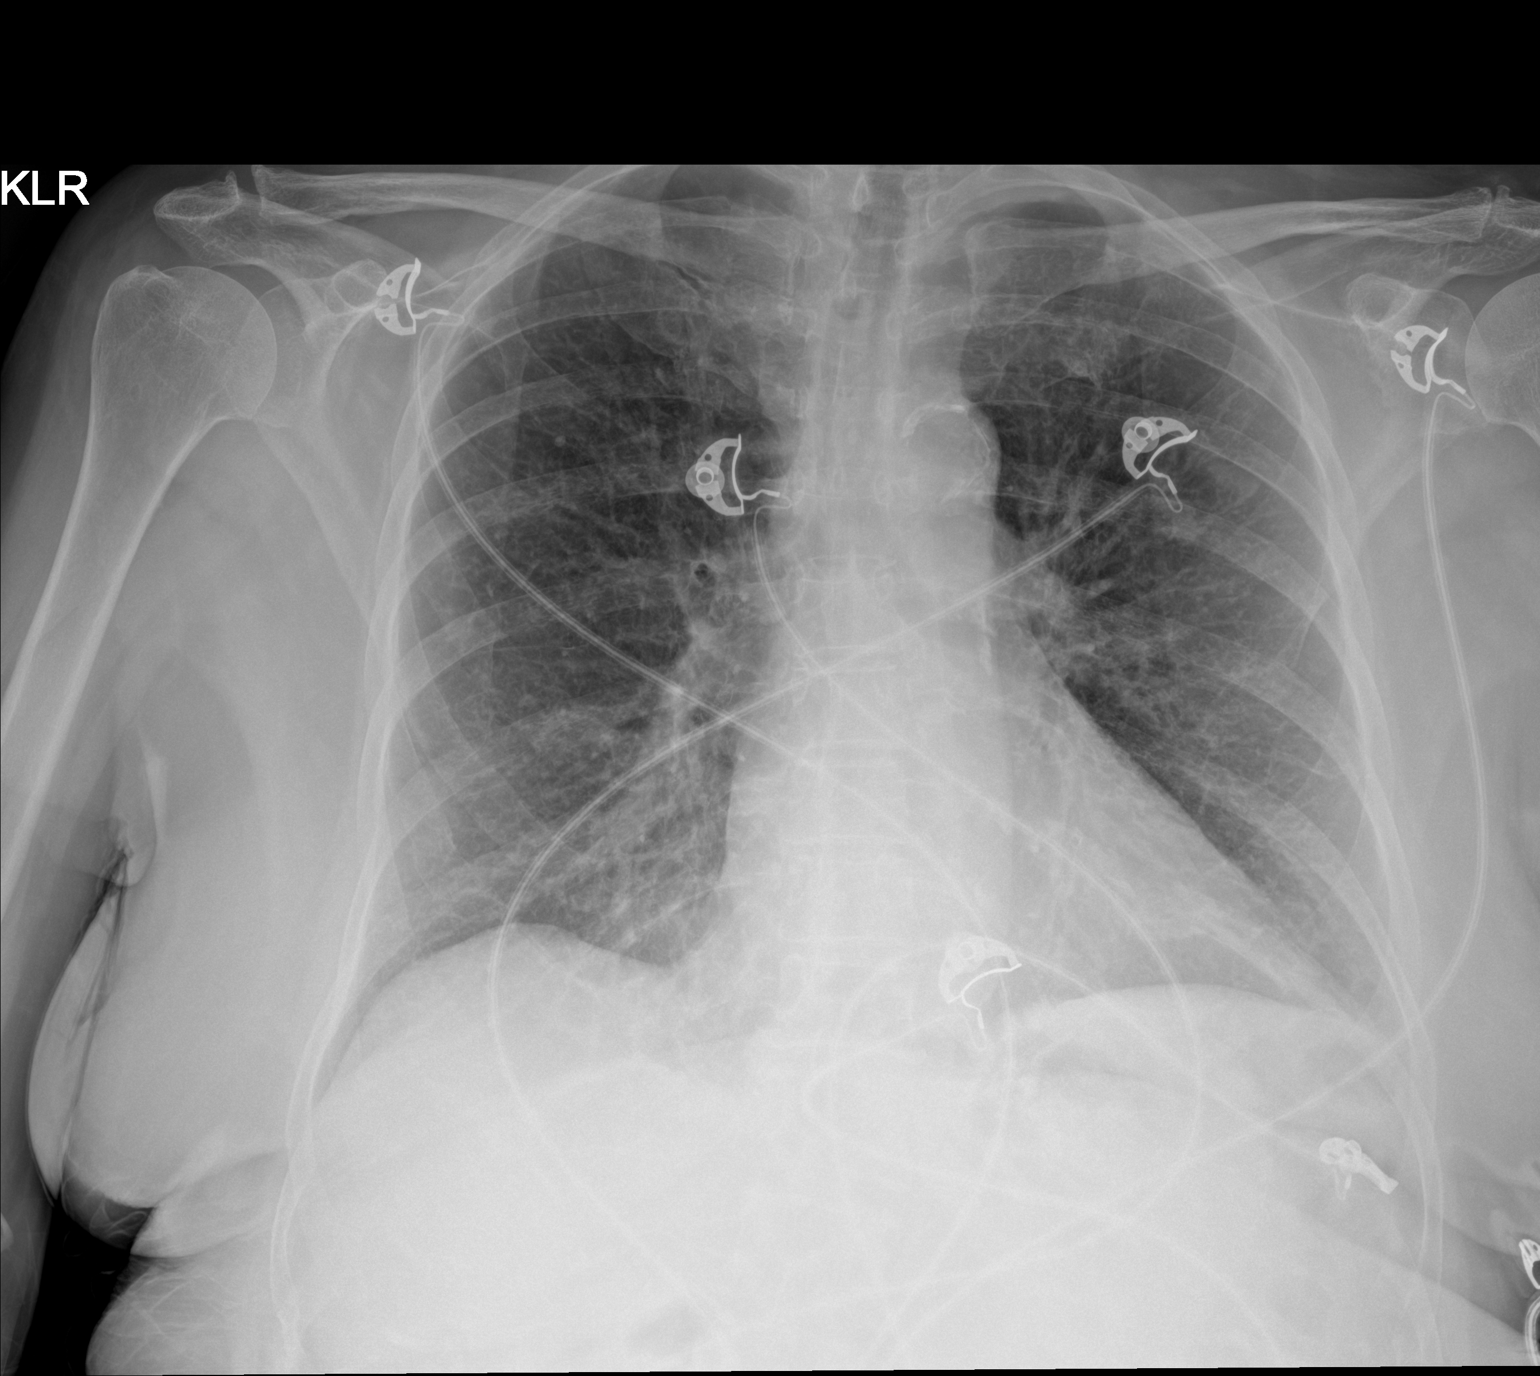

[1 of 1 positions shown; findings below may reference images not displayed]

FINDINGS: Heart size and mediastinal contours are within normal limits.
Atherosclerosis at the aortic arch. There are chronic bronchitic
changes centrally. Additional coarse interstitial markings
bilaterally suggests associated chronic interstitial lung disease.
No confluent opacity to suggest a developing pneumonia. No pleural
effusion or pneumothorax seen. Osseous structures about the chest
are unremarkable.
IMPRESSION: 1. No active disease. No evidence of pneumonia or pulmonary edema.
2. Chronic bronchitic changes and probable chronic interstitial lung
disease.
3. Aortic atherosclerosis.

## 2021-05-28 ENCOUNTER — Telehealth: Payer: Self-pay

## 2021-05-28 NOTE — Telephone Encounter (Signed)
Rn call to check on patient.  Daughter [name] reports some functional decline. Follow up appt scheduled for 10/24 at 11 am

## 2021-06-02 ENCOUNTER — Other Ambulatory Visit: Payer: PPO | Admitting: Student

## 2021-06-02 ENCOUNTER — Other Ambulatory Visit: Payer: Self-pay

## 2021-06-02 DIAGNOSIS — F03918 Unspecified dementia, unspecified severity, with other behavioral disturbance: Secondary | ICD-10-CM

## 2021-06-02 DIAGNOSIS — K5900 Constipation, unspecified: Secondary | ICD-10-CM | POA: Diagnosis not present

## 2021-06-02 DIAGNOSIS — Z515 Encounter for palliative care: Secondary | ICD-10-CM

## 2021-06-02 NOTE — Progress Notes (Signed)
[facility] Consult Note Telephone: [phone]  Fax: [phone]    Date of encounter: 06/02/21 11:28 AM PATIENT NAME: Joann [name] [place] [address]   [phone] (home)  DOB: 03-Nov-1932 MRN: [id number] PRIMARY CARE PROVIDER:    Marinda Elk, MD,  [place] [address] [phone]  REFERRING PROVIDER:   Marinda Elk, MD [facility] [place],  [place] [id number] [phone]  RESPONSIBLE PARTY:    Contact Information     Name Relation Home Work Mobile   Ilene Qua Daughter   [phone]   Skip Estimable [phone]          I met face to face with patient and family in the home. Palliative Care was asked to follow this patient by consultation request of  Marinda Elk, MD to address advance care planning and complex medical decision making. This is a follow up visit.                                   ASSESSMENT AND PLAN / RECOMMENDATIONS:   Advance Care Planning/Goals of Care: Goals include to maximize quality of life and symptom management.  CODE STATUS: DNR  Symptom Management/Plan:  Dementia-patient with supportive care from daughter and daily caregivers. Reorient/redirect as needed. Assist with all adl's, monitor for skin breakdown, falls/safety.   Constipation-continue miralax daily. Discussed using dulcolax suppository every other night. If this is not effective, we will add in Senna-S one tablet daily to start. Recommend diet with fiber, adequate fluids.   Follow up Palliative Care Visit: Palliative care will continue to follow for complex medical decision making, advance care planning, and clarification of goals. Return in 8 weeks or prn.  I spent 40 minutes providing this consultation. More than 50% of the time in this consultation was spent in counseling and care  coordination.    PPS: 30%  HOSPICE ELIGIBILITY/DIAGNOSIS: TBD  Chief Complaint: Palliative Medicine follow up visit.   HISTORY OF PRESENT ILLNESS:  Joann [name] is a 87 y.o. year old female  with dementia, hypertension, hyperlipidemia, hypothyroidism, abnormal weight loss, macular degeneration, B12 deficiency, OA, hx of right hip fracture ORIF.    Patient resides at home with daughter, has daily caregivers Monday through Friday and 1 evening a week.  Patient has been stable per daughter Lattie Haw, she has not been out of the bed since January.  She is dependent for all ADLs.  She is eating well, daughter states she often forgets that she has eaten.  No weight loss reported. She takes her medications whole. She drinks well most days; taking in adequate fluids.  Daughter and caregiver Lonn Georgia do endorse constipation.  She is currently receiving MiraLAX most days, daughter will occasionally administer Dulcolax tablets or fleets enema. Patient does sundown in the afternoons, will repeat herself. She is sleeping well at night, naps more during the day. No recent urinary tract infections, no ED visits or hospitalizations. A 10-point review of systems is negative, except for the pertinent positives and negatives detailed in the HPI.      History obtained from review of EMR, discussion with primary team, and interview with family, facility staff/caregiver and/or Ms. Nethery.  I reviewed available labs, medications, imaging, studies and related documents from the EMR.  Records reviewed and summarized above.    Physical  Exam: Pulse Pulse 64, resp 16, b/p 124/80, 96% on room air Constitutional: NAD General: frail appearing EYES: anicteric sclera, lids intact, no discharge  ENMT: intact hearing, oral mucous membranes moist, dentition intact CV: S1S2, RRR, no LE edema Pulmonary: LCTA, no increased work of breathing, no cough, room air Abdomen:normo-active BS + 4 quadrants, soft and non tender GU:  deferred MSK: moves all extremities, non-ambulatory, right leg shortened Skin: warm and dry, no rashes or wounds on visible skin Neuro: generalized weakness, A & O to person, familiars Psych: non-anxious affect, pleasant Hem/lymph/immuno: no widespread bruising   Thank you for the opportunity to participate in the care of Joann [name].  The palliative care team will continue to follow. Please call our office at [phone] if we can be of additional assistance.   Ezekiel Slocumb, NP   COVID-19 PATIENT SCREENING TOOL Asked and negative response unless otherwise noted:   Have you had symptoms of covid, tested positive or been in contact with someone with symptoms/positive test in the past 5-10 days? No

## 2021-07-28 ENCOUNTER — Other Ambulatory Visit: Payer: PPO | Admitting: Student

## 2021-07-28 ENCOUNTER — Other Ambulatory Visit: Payer: Self-pay

## 2021-07-28 DIAGNOSIS — Z515 Encounter for palliative care: Secondary | ICD-10-CM

## 2021-07-28 DIAGNOSIS — F0390 Unspecified dementia without behavioral disturbance: Secondary | ICD-10-CM

## 2021-07-28 DIAGNOSIS — K5900 Constipation, unspecified: Secondary | ICD-10-CM | POA: Diagnosis not present

## 2021-07-28 NOTE — Progress Notes (Signed)
[facility] Palliative Care Consult Note Telephone: [phone]  Fax: [phone]    Date of encounter: 07/28/21 11:24 AM PATIENT NAME: Joann [name] Joann [name] [id number]   [phone] (home)  DOB: [birth year] MRN: [id number] PRIMARY CARE PROVIDER:    Marinda Elk, MD,  [place] [address] [phone]  REFERRING PROVIDER:   Marinda Elk, MD [facility] [place],  [place] [address] [phone]  RESPONSIBLE PARTY:    Contact Information     Name Relation Home Work Mobile   Joann [name] Daughter   [phone]   Joann [name] [phone]          I met face to face with patient and caregiver in the home. Palliative Care was asked to follow this patient by consultation request of  Marinda Elk, MD to address advance care planning and complex medical decision making. This is a follow up visit.                                   ASSESSMENT AND PLAN / RECOMMENDATIONS:   Advance Care Planning/Goals of Care: Goals include to maximize quality of life and symptom management. Our advance care planning conversation included a discussion about:    The value and importance of advance care planning  Experiences with loved ones who have been seriously ill or have died  Exploration of personal, cultural or spiritual beliefs that might influence medical decisions  CODE STATUS: DNR  Symptom Management/Plan:  Dementia-patient with supportive care from daughter and daily caregivers. Reorient/redirect as needed. Assist with all adl's, monitor for skin breakdown, falls/safety.  Constipation-continue miralax daily.Continue dulcolax suppository as needed. Recommend diet with fiber, adequate fluids.   Follow up Palliative Care Visit: Palliative care will continue to follow for complex medical decision making, advance care planning, and  clarification of goals. Return in 8 weeks or prn.  I spent 40 minutes providing this consultation. More than 50% of the time in this consultation was spent in counseling and care coordination.   PPS: 30%  HOSPICE ELIGIBILITY/DIAGNOSIS: TBD  Chief Complaint: Palliative Medicine follow up visit .   HISTORY OF PRESENT ILLNESS:  Joann [name] is a 87 y.o. year old female  with dementia, hypertension, hyperlipidemia, hypothyroidism, abnormal weight loss, macular degeneration, B12 deficiency, OA, hx of right hip fracture ORIF.    Patient resides at home. Has private care givers daily. She has been stable per caregiver Gala. She denies pain, shortness of breath, no nausea. Constipation has improved with daily constipation; occasional suppository. Appetite is good; drinking well. She rarely gets out of bed. Care needs are anticipated by staff. She is sleeping well at night; naps during the day. No recent UTI's. No recent ED visits or hospitalizations.  A 10-point review of systems is negative, except for the pertinent positives and negatives detailed in the HPI.       History obtained from review of EMR, discussion with primary team, and interview with family, facility staff/caregiver and/or Ms. Shawler.  I reviewed available labs, medications, imaging, studies and related documents from the EMR.  Records reviewed and summarized above.    Physical Exam: Pulse 72, resp 16, b/p 120/70, sats 97% on room air Constitutional: NAD General: frail appearing EYES: anicteric sclera, lids intact, no discharge  ENMT: intact hearing, oral mucous membranes moist,  dentition intact CV: S1S2, RRR, no LE edema Pulmonary: LCTA, no increased work of breathing, no cough, room air Abdomen: normo-active BS + 4 quadrants, soft and non tender, no ascites GU: deferred MSK: non- ambulatory Skin: warm and dry, no rashes or wounds on visible skin Neuro: generalized weakness, A & O to person, familiars Psych:  non-anxious affect, pleasant  Hem/lymph/immuno: no widespread bruising   Thank you for the opportunity to participate in the care of Ms. Saunders.  The palliative care team will continue to follow. Please call our office at [phone] if we can be of additional assistance.   Ezekiel Slocumb, NP   COVID-19 PATIENT SCREENING TOOL Asked and negative response unless otherwise noted:   Have you had symptoms of covid, tested positive or been in contact with someone with symptoms/positive test in the past 5-10 days? No

## 2021-09-29 ENCOUNTER — Telehealth: Payer: Self-pay

## 2021-09-29 NOTE — Telephone Encounter (Signed)
Palliative care visit scheduled for 10/01/21 @ 10:30am

## 2021-10-01 ENCOUNTER — Other Ambulatory Visit: Payer: PPO

## 2021-10-01 ENCOUNTER — Other Ambulatory Visit: Payer: Self-pay

## 2021-10-01 VITALS — BP 122/70 | HR 79 | Resp 18

## 2021-10-01 DIAGNOSIS — Z515 Encounter for palliative care: Secondary | ICD-10-CM

## 2021-10-01 NOTE — Progress Notes (Signed)
PATIENT NAME: Joann [name] DOB: 1932-08-21 MRN: [id number]  PRIMARY CARE PROVIDER: Marinda Elk, MD  RESPONSIBLE PARTY:  Acct ID - Guarantor Home Phone Work Phone Relationship Acct Type  [id number] DEETTE, [phone]  Self P/F     [address], [place], [place] [address]   PLAN OF CARE and INTERVENTION:  ADVANCE CARE PLANNING/GOALS OF CARE: comfort, safety, DNR  CAREGIVER EDUCATION: Safety, Symptom Management. DISEASE STATUS: RN Palliative care visit completed today. Met with patient, daughter Lattie Haw) and caregiver in patient's home. Patient was awake and verbally responsive. She was feeding herself breakfast when this RN arrived. Patient denies any pain nor shortness of breath. She is dependent on caregivers for ADLs. She is incontinent of bowel and bladder. Patient is given suppositories as needed for constipation and well as MiraLAX. Patient is sleeping throughout most nights. Per Lattie Haw, patient is usually calm and cooperative with only rare episodes of agitation. No concerns verbalized at this time.   HISTORY OF PRESENT ILLNESS: This is an 88- year-old female with a diagnosis of dementia, macular degeneration, osteoarthritis, and hypertension. Palliative care has been asked to follow for additional support, care and complex decision making.   CODE STATUS: DNR PPS: 30% Physical Exam:  CV: RRR, no edema, denies any chest pain. Pulmonary: Respirations unlabored, LS CTA, no audible cough nor congestion Abdomen: Soft, non-tender, incontinent of bowel Skin: No visible rashes or wounds. Caregivers report skin is intact. Neuro: Weakness, family noted worsening confusion   Cornelius Moras, RN

## 2021-12-08 ENCOUNTER — Telehealth: Payer: Self-pay

## 2021-12-08 NOTE — Telephone Encounter (Signed)
3 pm.  Return call received from daughter Lattie Haw.  Visit scheduled for 12/18/21 @ 1 pm. ?

## 2021-12-08 NOTE — Telephone Encounter (Signed)
1105 am.  Phone call made to daughter [name] to follow up on patient status and offer a home visit.  No answer.  Message left.  ?

## 2021-12-18 ENCOUNTER — Other Ambulatory Visit: Payer: PPO | Admitting: Student

## 2021-12-18 DIAGNOSIS — K5900 Constipation, unspecified: Secondary | ICD-10-CM

## 2021-12-18 DIAGNOSIS — F0390 Unspecified dementia without behavioral disturbance: Secondary | ICD-10-CM | POA: Diagnosis not present

## 2021-12-18 DIAGNOSIS — Z515 Encounter for palliative care: Secondary | ICD-10-CM | POA: Diagnosis not present

## 2021-12-18 NOTE — Progress Notes (Signed)
? ? ?Manufacturing engineer ?Community Palliative Care Consult Note ?Telephone: [phone]  ?Fax: [phone]  ? ? ?Date of encounter: 12/18/21 ?1:19 PM ?PATIENT NAME: Joann [name] ?[address] ?[place] [address]   ?[phone] (home)  ?DOB: Feb 28, 1933 ?MRN: [id number] ?PRIMARY CARE PROVIDER:    ?Joann Elk, MD,  ?[place] [address] ?[phone] ? ?REFERRING PROVIDER:   ?Joann Elk, MD ?[address] ?[place],  [place] [address] ?[phone] ? ?RESPONSIBLE PARTY:    ?Contact Information   ? ? Name Relation Home Work Mobile  ? Joann [name] Daughter   [phone]  ? Joann [name] Sister [phone]    ? ?  ? ? ? ?I met face to face with patient and caregiver in the home. Palliative Care was asked to follow this patient by consultation request of  Joann Elk, MD to address advance care planning and complex medical decision making. This is a follow up visit. ? ?                                 ASSESSMENT AND PLAN / RECOMMENDATIONS:  ? ?Advance Care Planning/Goals of Care: Goals include to maximize quality of life and symptom management. Patient/health care surrogate gave his/her permission to discuss. ? ?CODE STATUS: DNR ? ?Palliative Medicine will continue to provide ongoing support, monitor for changes and declines.  ? ?Symptom Management/Plan: ? ?Dementia-patient has been stable. Receives supportive care from daughter and daily caregivers. Reorient/redirect as needed. Assist with all adl's, monitor for skin breakdown, falls/safety. Continues with good appetite, no swallowing difficulty, no recent infections. Will monitor for changes and declines.  ? ?Constipation-caregiver endorses regular bowel movements with taking miralax. Continue miralax, dulcolax suppository as directed.  ? ?Follow up Palliative Care Visit: Palliative care will continue to follow for complex medical decision making,  advance care planning, and clarification of goals. Return in 8 weeks or prn. ? ? ?This visit was coded based on medical decision making (MDM). ? ?PPS: 30% ? ?HOSPICE ELIGIBILITY/DIAGNOSIS: TBD ? ?Chief Complaint: Palliative Medicine follow up visit.  ? ?HISTORY OF PRESENT ILLNESS:  Joann [name] is a 88 y.o. year old female  with dementia, hypertension, hyperlipidemia, hypothyroidism, abnormal weight loss, macular degeneration, B12 deficiency, OA, hx of right hip fracture ORIF.  ? ?Patient resides at home. Has private care givers daily. Caregiver states patient has been doing well, stable. She is dependent for all adl's. She is occasionally out of bed to [employer]. She is eating well, requires some assistance with eating, no swallowing difficulty reported. No recent infections.  ? ?Patient received resting in bed. She is pleasant, talkative. She engages in conversation although it is mostly nonsensical. She denies pain, shortness of breath. She appears comfortable. Patient is able to answer direct questions; due to her dementia HPI and ROS primarily obtained from caregiver.  ? ?History obtained from review of EMR, discussion with primary team, and interview with family, facility staff/caregiver and/or Joann [name].  ?I reviewed available labs, medications, imaging, studies and related documents from the EMR.  Records reviewed and summarized above.  ? ?Physical Exam: ? ?Pulse 76, resp 20, b/p 110/62, sats 97% on room air ?Constitutional: NAD ?General: frail appearing ?EYES: anicteric sclera, lids intact, no discharge  ?ENMT: intact hearing, oral mucous membranes moist, dentition intact ?CV: S1S2, RRR, no LE edema ?Pulmonary: LCTA, no increased work  of breathing, no cough, room air ?Abdomen: normo-active BS + 4 quadrants, soft and non tender, no ascites ?GU: deferred ?MSK: non-ambulatory ?Skin: warm and dry, no rashes or wounds on visible skin ?Neuro: +generalized weakness,  A & O to person,  familiars ?Psych: non-anxious affect, pleasant ?Hem/lymph/immuno: no widespread bruising ? ? ?Thank you for the opportunity to participate in the care of Joann [name].  The palliative care team will continue to follow. Please call our office at [phone] if we can be of additional assistance.  ? ?Ezekiel Slocumb, NP  ? ?COVID-19 PATIENT SCREENING TOOL ?Asked and negative response unless otherwise noted:  ? ?Have you had symptoms of covid, tested positive or been in contact with someone with symptoms/positive test in the past 5-10 days? No ? ?

## 2022-02-12 ENCOUNTER — Other Ambulatory Visit: Payer: PPO

## 2022-02-12 DIAGNOSIS — Z515 Encounter for palliative care: Secondary | ICD-10-CM

## 2022-02-12 NOTE — Progress Notes (Signed)
COMMUNITY PALLIATIVE CARE SW NOTE  PATIENT NAME: Joann [name] DOB: [birth year] MRN: [id number]  PRIMARY CARE PROVIDER: Patrice Paradise, MD  RESPONSIBLE PARTY:  Acct ID - Guarantor Home Phone Work Phone Relationship Acct Type  [id number] Joann [name], Joann [name]* [id number]  Self P/F     [address] [place], [place], [place] [address]   Palliative care SW outreached patient to complete telephonic visit.    Palliative care SW outreached patient to complete telephonic visit. Daughter, [name], provided update on medical condition and/or changes.   Patient endorses that she is doing well. Daughter shares that she does have some pain mostly in her leg and pain. Continues to be total care with ADL's. Patient is completely bedbound and does not get up to geri chair. Per daughter, only concern is patient's cognition continues to decline due to dementia.   Hospitalizations: none recent.   PCP: Hasn't seen PCP Dr. Merlinda Frederick in about 2 years. No longer able to go out for appointments.   Appetite: patients appetite remains fair - good. No significant weight changes noted.  Wound: no skin breakdown.   No medication needs or adjustments noted. Patient is compliant with medications.    Psychosocial assessment: Completed. Patient continues to have private caregivers daily. No financial, food insecurities or issues with transportation noted during call. No other psychosocial needs. No S/S of depression or anxiety noted.   Next PC Visit scheduled for 03/12/22 @2pm  with PC NP L. Rivers.    Palliative care will continue to monitor and assist with long term care planning as needed.     SOCIAL HX:  Social History   Tobacco Use   Smoking status: Every Day    Packs/day: 0.50    Types: Cigarettes   Smokeless tobacco: Never  Substance Use Topics   Alcohol use: Yes    Comment: rarely    CODE STATUS: DNR          , LCSW

## 2022-03-12 ENCOUNTER — Other Ambulatory Visit: Payer: PPO | Admitting: Student

## 2022-03-12 DIAGNOSIS — Z515 Encounter for palliative care: Secondary | ICD-10-CM

## 2022-03-12 DIAGNOSIS — F0390 Unspecified dementia without behavioral disturbance: Secondary | ICD-10-CM | POA: Diagnosis not present

## 2022-03-12 DIAGNOSIS — L539 Erythematous condition, unspecified: Secondary | ICD-10-CM | POA: Diagnosis not present

## 2022-03-12 DIAGNOSIS — R52 Pain, unspecified: Secondary | ICD-10-CM | POA: Diagnosis not present

## 2022-03-12 NOTE — Progress Notes (Signed)
[facility] Palliative Care Consult Note Telephone: [phone]  Fax: [phone]    Date of encounter: 03/12/22 2:04 PM PATIENT NAME: Joann [name] [place] [address]   [phone] (home)  DOB: 09-03-1932 MRN: [id number] PRIMARY CARE PROVIDER:    Marinda Elk, MD,  [address] [place] [address] [phone]  REFERRING PROVIDER:   Marinda Elk, MD [facility] [place],  [place] [address] [phone]  RESPONSIBLE PARTY:    Contact Information     Name Relation Home Work Mobile   Ilene Qua Daughter   [phone]   Skip Estimable [phone]          I met face to face with patient and family in the home. Palliative Care was asked to follow this patient by consultation request of  Marinda Elk, MD to address advance care planning and complex medical decision making. This is a follow up visit.                                   ASSESSMENT AND PLAN / RECOMMENDATIONS:   Advance Care Planning/Goals of Care: Goals include to maximize quality of life and symptom management. Patient/health care surrogate gave his/her permission to discuss. CODE STATUS: DNR  Symptom Management/Plan:  Dementia-patient with increased forgetfulness; she will forget she has eaten. She continues to have daily caregivers and lives with daughter. She is dependent for adl's; rarely out of bed. Good appetite; actual weight gain reported as evidenced by her clothes fitting tighter. No recent infections. Will monitor for further changes and declines.   Pain-occasional knee pain. Recommend acetaminophen 500 mg every 6 hours PRN.   Redness to heel-patient with blanchable redness to right heel. Caused by how she is resting her right foot on her left leg. Caregiver encouraged to place pillow in between heels to prevent friction/pressure.  Follow up Palliative  Care Visit: Palliative care will continue to follow for complex medical decision making, advance care planning, and clarification of goals. Return in 8 weeks or prn.  This visit was coded based on medical decision making (MDM).  PPS: 30%  HOSPICE ELIGIBILITY/DIAGNOSIS: TBD  Chief Complaint: Palliative Medicine follow up visit.   HISTORY OF PRESENT ILLNESS:  Joann [name] is a 88 y.o. year old female  with dementia, hypertension, hyperlipidemia, hypothyroidism, abnormal weight loss, macular degeneration, B12 deficiency, OA, hx of right hip fracture ORIF.    Caregiver reports patient doing well; no acute changes other than increased forgetfulness. Does not recall eating a meal and will ask to eat again within a couple of minutes She occasionally complains of pain to her knees. Has thick left great toe nail that needs to be removed. Redness to right inner heel. No recent infections. No swallowing difficulty. HPI and ROS primarily obtained from caregiver d/t patient's dementia.  Patient received resting in bed. She arouses to stimulation. Patient answers direct questions although conversation is mostly nonsensical. She speaks about her mother and daughter.  History obtained from review of EMR, discussion with primary team, and interview with family, facility staff/caregiver and/or Ms. Oldfield.  I reviewed available labs, medications, imaging, studies and related documents from the EMR.  Records reviewed and summarized above.   ROS  Patient unable to substantially contribute d/t her dementia.   Physical Exam: Pulse 72, resp 16, b/p 130/72, sats 95%  on room air Constitutional: NAD General: frail appearing EYES: anicteric sclera, lids intact, no discharge  ENMT: intact hearing, oral mucous membranes moist CV: S1S2, RRR, no LE edema Pulmonary: LCTA, no increased work of breathing, no cough, room air Abdomen: normo-active BS + 4 quadrants, soft and non tender, no ascites GU:  deferred MSK: moves all extremities, ambulatory Skin: warm and dry, right heel red, blanches to touch, left great toe nail yellow, thick Neuro: generalized weakness, A & O to person, familiars Psych: non-anxious affect, pleasant Hem/lymph/immuno: no widespread bruising   Thank you for the opportunity to participate in the care of Ms. Arauz.  The palliative care team will continue to follow. Please call our office at [phone] if we can be of additional assistance.   Ezekiel Slocumb, NP   COVID-19 PATIENT SCREENING TOOL Asked and negative response unless otherwise noted:   Have you had symptoms of covid, tested positive or been in contact with someone with symptoms/positive test in the past 5-10 days? No

## 2022-03-27 DIAGNOSIS — F0280 Dementia in other diseases classified elsewhere without behavioral disturbance: Secondary | ICD-10-CM | POA: Diagnosis not present

## 2022-03-27 DIAGNOSIS — E038 Other specified hypothyroidism: Secondary | ICD-10-CM | POA: Diagnosis not present

## 2022-03-27 DIAGNOSIS — M1990 Unspecified osteoarthritis, unspecified site: Secondary | ICD-10-CM | POA: Diagnosis not present

## 2022-03-27 DIAGNOSIS — E663 Overweight: Secondary | ICD-10-CM | POA: Diagnosis not present

## 2022-03-27 DIAGNOSIS — H3530 Unspecified macular degeneration: Secondary | ICD-10-CM | POA: Diagnosis not present

## 2022-03-27 DIAGNOSIS — E039 Hypothyroidism, unspecified: Secondary | ICD-10-CM | POA: Diagnosis not present

## 2022-03-27 DIAGNOSIS — G4700 Insomnia, unspecified: Secondary | ICD-10-CM | POA: Diagnosis not present

## 2022-03-27 DIAGNOSIS — G309 Alzheimer's disease, unspecified: Secondary | ICD-10-CM | POA: Diagnosis not present

## 2022-03-27 DIAGNOSIS — F419 Anxiety disorder, unspecified: Secondary | ICD-10-CM | POA: Diagnosis not present

## 2022-03-27 DIAGNOSIS — E785 Hyperlipidemia, unspecified: Secondary | ICD-10-CM | POA: Diagnosis not present

## 2022-03-27 DIAGNOSIS — F3342 Major depressive disorder, recurrent, in full remission: Secondary | ICD-10-CM | POA: Diagnosis not present

## 2022-03-27 DIAGNOSIS — I10 Essential (primary) hypertension: Secondary | ICD-10-CM | POA: Diagnosis not present

## 2022-09-09 DIAGNOSIS — F03918 Unspecified dementia, unspecified severity, with other behavioral disturbance: Secondary | ICD-10-CM | POA: Diagnosis not present

## 2022-09-09 NOTE — Progress Notes (Signed)
 Telemedicine video encounter documentation  This video encounter was conducted with the patient's (or proxy's) verbal consent via secure, interactive audio and video telecommunications while in clinic/office/hospital.  The patient (or proxy) was instructed to have this encounter in a suitably private space and to only have persons present to whom they give permission to participate. In addition, patient identity was confirmed by use of name plus two identifiers.  Chief Complaint:   Chief Complaint  Patient presents with  . Follow-up   Subjective:   Joann [name] is a 89 y.o. female established patient. Video visit today for:  HPI Dementia- bedbound. Unable to care for self at all.  She has had hospice care vs. Palliative care intermittently over the past several years.  Incontinent. Feed finger foods. Developing sore over sacrum. Sleeps approx 12/24hrs. Does not try to get out of the bed. She is turned by family and 5 days per week, 8hrs per day.  Right knee swelling- with abrasion- no heat. Has been present for months.   Patient Active Problem List  Diagnosis  . B12 deficiency  . Dementia with behavioral disturbance (CMS-HCC)  . Hallucination, visual  . Hypertension  . Macular degeneration  . Acquired hypothyroidism  . Hyperlipidemia, mixed  . Age-related osteoporosis with current pathological fracture with routine healing  . Dementia (CMS-HCC)  . Closed right hip fracture (CMS-HCC)  . GERD (gastroesophageal reflux disease)  . Protein-calorie malnutrition (CMS-HCC)  . Status post hip surgery    Current Outpatient Medications:  .  acetaminophen  (TYLENOL ) 325 MG tablet, Take 650 mg by mouth 3 (three) times daily as needed, Disp: , Rfl:  .  donepeziL  (ARICEPT ) 10 MG tablet, Take 1 tablet (10 mg total) by mouth at bedtime, Disp: 90 tablet, Rfl: 1 .  folic acid  (FOLVITE ) 1 MG tablet, Take 1 mg by mouth once daily., Disp: , Rfl:  .  levothyroxine  (SYNTHROID ) 25 MCG tablet,  Take 1 tablet (25 mcg total) by mouth once daily Take on an empty stomach with a glass of water at least 30-60 minutes before breakfast., Disp: 90 tablet, Rfl: 1 .  mirtazapine  (REMERON ) 7.5 MG tablet, Take 1 tablet (7.5 mg total) by mouth at bedtime, Disp: 90 tablet, Rfl: 1 .  QUEtiapine  (SEROQUEL ) 50 MG tablet, Take 1 tablet (50 mg total) by mouth 2 (two) times daily, Disp: 180 tablet, Rfl: 1 .  sertraline  (ZOLOFT ) 50 MG tablet, Take 1 tablet (50 mg total) by mouth once daily, Disp: 90 tablet, Rfl: 1 .  ZINC  ACETATE ORAL, Take by mouth., Disp: , Rfl:  .  miscellaneous medical supply Misc, Semi-electric Hospital bed Dx: alzheimer's dementia, swallowing difficulties, mobility issues, prior hip fracture., Disp: 1 each, Rfl: 0 .  traMADoL (ULTRAM) 50 mg tablet, Take 1 tablet (50 mg total) by mouth 3 (three) times daily as needed (Patient not taking: Reported on 09/09/2022), Disp: 30 tablet, Rfl: 1  ROS  Per HPI Objective:   Vitals as reported by patient: Vitals:   09/09/22 1547  Height: 154.9 cm (5' 1)  PainSc: 0-No pain  PainLoc: Knee   Body mass index is 25.89 kg/m.  Constitutional: alert, interactive with provider, cooperative, in no distress, demented, in bed.  Mental status: thought content appears normal, confused Respiratory: no respiratory distress, no audible wheezing Skin: left knee abrasion, swelling.   Assessment/Plan:   Diagnoses and all orders for this visit:  Dementia with behavioral disturbance (CMS-HCC) -     Ambulatory Referral to Palliative Care  Primary hypertension  Acquired hypothyroidism  Hyperlipidemia, mixed -     Ambulatory Referral to Palliative Care  Hallucination, visual  Pressure injury of skin of sacral region, unspecified injury stage -     Ambulatory Referral to Palliative Care  Swelling of left knee joint -     Ambulatory Referral to Palliative Care  Bedbound -     Ambulatory Referral to Palliative Care  Other orders -     donepeziL   (ARICEPT ) 10 MG tablet; Take 1 tablet (10 mg total) by mouth at bedtime -     levothyroxine  (SYNTHROID ) 25 MCG tablet; Take 1 tablet (25 mcg total) by mouth once daily Take on an empty stomach with a glass of water at least 30-60 minutes before breakfast. -     mirtazapine  (REMERON ) 7.5 MG tablet; Take 1 tablet (7.5 mg total) by mouth at bedtime -     QUEtiapine  (SEROQUEL ) 50 MG tablet; Take 1 tablet (50 mg total) by mouth 2 (two) times daily -     sertraline  (ZOLOFT ) 50 MG tablet; Take 1 tablet (50 mg total) by mouth once daily     This visit was coded based on medical decision making (MDM).          Future Appointments   This patient does not currently have any appointments scheduled.     There are no Patient Instructions on file for this visit.   An after visit summary was provided for the patient either in written format (printed) or through MyChart.  Eva Crimes, PA-C

## 2022-09-14 NOTE — Progress Notes (Signed)
 ENCOUNTER: Patient Class :No patient class for patient encounter Department: [facility] [address] [place] [address]  PATIENT: Patient Demographics      Name Patient ID SSN Gender Identity Birth Date   Joann [name], Joann [name] [id number] Female [birth year] (89 yrs)          Address Phone Email       [address] [place] [address] [phone] (M) [email or web address] Caucasian/White             Reg Status PCP Date Last Verified Next Review Date     Verified Marikay Eva Hausen [phone] 09/08/22 10/08/22           Marital Status Religion Language       Widowed Unknown-Patient Declined English              EMERGENCY CONTACT: Name Relationship Lgl Grd Work Administrator, sports  1. PERI OLAM Blades or Daughter   [phone]     GUARANTOR: There is no guarantor information entered for this encounter.  COVERAGE: Primary Visit Coverage      Payer Plan Group Number Group Name Payer Phone Plan Phone   No coverage found                Secondary Visit Coverage      Payer Plan Group Number Group Name Payer Phone Plan Phone   No coverage found                Primary Coverage      Payer Plan Group Number Group Name Payer Phone Plan Phone   [employer] [facility] ADVANTAGE                Primary Subscriber      Subscriber ID Subscriber Name [facility] Subscriber Address   [id number] MONTEZ, STRYKER [id number] [address] [place], [place] [address]           Secondary Coverage      Payer Plan Group Number Group Name Payer Phone Plan Phone   No coverage found

## 2022-09-22 DIAGNOSIS — R32 Unspecified urinary incontinence: Secondary | ICD-10-CM | POA: Diagnosis not present

## 2022-09-22 DIAGNOSIS — F419 Anxiety disorder, unspecified: Secondary | ICD-10-CM | POA: Diagnosis not present

## 2022-09-22 DIAGNOSIS — E039 Hypothyroidism, unspecified: Secondary | ICD-10-CM | POA: Diagnosis not present

## 2022-09-22 DIAGNOSIS — E663 Overweight: Secondary | ICD-10-CM | POA: Diagnosis not present

## 2022-09-22 DIAGNOSIS — G309 Alzheimer's disease, unspecified: Secondary | ICD-10-CM | POA: Diagnosis not present

## 2022-09-22 DIAGNOSIS — H3530 Unspecified macular degeneration: Secondary | ICD-10-CM | POA: Diagnosis not present

## 2022-09-22 DIAGNOSIS — F03918 Unspecified dementia, unspecified severity, with other behavioral disturbance: Secondary | ICD-10-CM | POA: Diagnosis not present

## 2023-07-07 DIAGNOSIS — R8290 Unspecified abnormal findings in urine: Secondary | ICD-10-CM | POA: Diagnosis not present

## 2023-11-03 DIAGNOSIS — Z0000 Encounter for general adult medical examination without abnormal findings: Secondary | ICD-10-CM | POA: Diagnosis not present

## 2023-11-03 DIAGNOSIS — F03918 Unspecified dementia, unspecified severity, with other behavioral disturbance: Secondary | ICD-10-CM | POA: Diagnosis not present

## 2023-11-03 DIAGNOSIS — R441 Visual hallucinations: Secondary | ICD-10-CM | POA: Diagnosis not present

## 2023-11-03 DIAGNOSIS — E782 Mixed hyperlipidemia: Secondary | ICD-10-CM | POA: Diagnosis not present

## 2023-11-03 DIAGNOSIS — E039 Hypothyroidism, unspecified: Secondary | ICD-10-CM | POA: Diagnosis not present

## 2023-11-03 DIAGNOSIS — I10 Essential (primary) hypertension: Secondary | ICD-10-CM | POA: Diagnosis not present

## 2023-11-03 NOTE — Progress Notes (Signed)
 Telemedicine video encounter documentation  This video encounter was conducted with the patient's (or proxy's) verbal consent via secure, interactive audio and video telecommunications while in clinic/office/hospital.  The patient (or proxy) was instructed to have this encounter in a suitably private space and to only have persons present to whom they give permission to participate. In addition, patient identity was confirmed by use of name plus two identifiers.   Chief Complaint:   Chief Complaint  Patient presents with  . Follow-up    Medications and restlessness     Subjective:   Joann [name] is a 89 y.o. female established patient. Video visit today for:  History of Present Illness Joann [name] is an 89 year old female who is seen by telemedicine with right breast discharge. She is accompanied by her caregiver.  She presents with right breast discharge, described as fluid-like and possibly blood-tinged, first noticed during bathing. This issue has occurred previously, but no prior interventions have been undertaken. There is no associated pain, redness, or palpable lumps. The caregiver is concerned about a possible infection, although there is no direct evidence of systemic symptoms such as fever or chills.  She has a history of dementia, progressively worsening over the past eleven years, with symptoms including agitation, inappropriate urination, and hallucinations, such as sensations of 'worms in her face.' She is currently on quetiapine  100 mg at night, which has been somewhat effective. The caregiver reports weight loss, potentially related to reduced appetite and muscle loss due to being bedbound for over three years.  She has been bedbound for over three years and requires assistance with all activities of daily living. She tends to put non-food items in her mouth and has been observed biting and chewing on various objects. The caregiver is vigilant about medication  adherence, as she sometimes attempts to spit out pills.   Patient Active Problem List  Diagnosis  . B12 deficiency  . Dementia with behavioral disturbance (CMS/HHS-HCC)  . Hallucination, visual  . Hypertension  . Macular degeneration  . Acquired hypothyroidism  . Hyperlipidemia, mixed  . Age-related osteoporosis with current pathological fracture with routine healing  . Dementia (CMS/HHS-HCC)  . Closed right hip fracture (CMS/HHS-HCC)  . GERD (gastroesophageal reflux disease)  . Protein-calorie malnutrition (CMS/HHS-HCC)  . Status post hip surgery    Current Outpatient Medications:  .  acetaminophen  (TYLENOL ) 325 MG tablet, Take 650 mg by mouth 3 (three) times daily as needed, Disp: , Rfl:  .  donepeziL  (ARICEPT ) 10 MG tablet, TAKE 1 TABLET BY MOUTH AT BEDTIME, Disp: 90 tablet, Rfl: 1 .  folic acid  (FOLVITE ) 1 MG tablet, Take 1 mg by mouth once daily., Disp: , Rfl:  .  levothyroxine  (SYNTHROID ) 25 MCG tablet, Take 1 tablet (25 mcg total) by mouth once daily Take on an empty stomach with a glass of water at least 30-60 minutes before breakfast., Disp: 90 tablet, Rfl: 1 .  mirtazapine  (REMERON ) 7.5 MG tablet, Take 1 tablet (7.5 mg total) by mouth at bedtime, Disp: 90 tablet, Rfl: 1 .  QUEtiapine  (SEROQUEL ) 50 MG tablet, Take 1 tablet (50 mg total) by mouth 3 (three) times daily 50mg  q am 100mg  q pm, Disp: 180 tablet, Rfl: 0 .  sertraline  (ZOLOFT ) 50 MG tablet, Take 1 tablet by mouth once daily, Disp: 90 tablet, Rfl: 0 .  ZINC  ACETATE ORAL, Take by mouth., Disp: , Rfl:  .  doxycycline (VIBRAMYCIN) 100 MG capsule, Take 1 capsule (100 mg total) by mouth 2 (two)  times daily for 10 days, Disp: 20 capsule, Rfl: 0 .  miscellaneous medical supply Misc, Semi-electric Hospital bed Dx: alzheimer's dementia, swallowing difficulties, mobility issues, prior hip fracture., Disp: 1 each, Rfl: 0 .  silicone,dressing-foam bandage 9 X 9  Bndg, Apply 1 Application topically once a week, Disp: 5 each, Rfl:  1 .  traMADoL (ULTRAM) 50 mg tablet, Take 1 tablet (50 mg total) by mouth 3 (three) times daily as needed (Patient not taking: Reported on 11/03/2023), Disp: 30 tablet, Rfl: 1   ROS  Per HPI  Objective:   Vitals as reported by patient: There were no vitals filed for this visit. There is no height or weight on file to calculate BMI. Physical Exam BREAST: Right breast leaking fluid, cracked, swollen  Constitutional: alert, interactive with provider, cooperative, in no distress Mental status: oriented x 3, good historian, appropriate mood and behavior, thought content appears normal  Assessment/Plan:   Assessment & Plan Nipple discharge Recurrent discharge from the right breast without palpable lump or erythema. Differential includes infection or breast cancer. Mammography not feasible, ultrasound challenging. Prioritized infection treatment. - Prescribed doxycycline for possible infection. - Avoid nipple manipulation.  Dementia with behavioral disturbance Persistent agitation, hallucinations, and inappropriate behaviors despite quetiapine  100 mg at night. Plan to increase quetiapine  dosage to manage symptoms. Monitor for side effects. - Increase quetiapine  to 75 mg twice daily. - Monitor for worsening symptoms or side effects such as jaw movements or chewing behaviors. - Adjust medication if side effects occur.  Weight loss Significant weight loss possibly due to muscle atrophy from bedbound status. Consider thyroid  dysfunction due to hypothyroidism. No immediate hyperthyroidism symptoms, but monitoring advised. - Monitor weight and nutritional intake. - Consider thyroid  function testing if hyperthyroidism symptoms occur. Diagnoses and all orders for this visit:  Dementia with behavioral disturbance (CMS/HHS-HCC)  Hallucination, visual  Primary hypertension  Hyperlipidemia, mixed  Acquired hypothyroidism  Other orders -     QUEtiapine  (SEROQUEL ) 50 MG tablet; Take 1  tablet (50 mg total) by mouth 3 (three) times daily 50mg  q am 100mg  q pm -     doxycycline (VIBRAMYCIN) 100 MG capsule; Take 1 capsule (100 mg total) by mouth 2 (two) times daily for 10 days    This visit was coded based on medical decision making (MDM).            Future Appointments   This patient does not currently have any appointments scheduled.     There are no Patient Instructions on file for this visit.   An after visit summary was provided for the patient either in written format (printed) or through MyChart.  This note has been created using automated tools and reviewed for accuracy by MIRIAM KLEM MCLAUGHLIN.  Eva Crimes, PA-C

## 2024-02-15 NOTE — Progress Notes (Signed)
 ENCOUNTER: Patient Class :No patient class for patient encounter Department: [facility] [address] [place] [address]  PATIENT: Patient Demographics      Name Patient ID SSN Gender Identity Birth Date   [name], Joann [id number] Female [birth year] (89 yrs)          Address Phone Email       [address] [place] [address] [phone] (H) [email or web address]            [facility] Caucasian/White             Reg Status PCP Date Last Verified Next Review Date     ELAPSED Marikay Eva Hausen [id number] 11/02/23 12/02/23           Marital Status Religion Language       Widowed Unknown-Patient Declined English              EMERGENCY CONTACT: Name Relationship Lgl Grd Work Administrator, sports  1. PERI OLAM Blades or Daughter   [phone]     GUARANTOR: There is no guarantor information entered for this encounter.  COVERAGE: Primary Visit Coverage      Payer Plan Group Number Group Name Payer Phone Plan Phone   No coverage found                Secondary Visit Coverage      Payer Plan Group Number Group Name Payer Phone Plan Phone   No coverage found                Primary Coverage      Payer Plan Group Number Group Name Payer Phone Plan Phone   [employer] [facility] ADVANTAGE                Primary Subscriber      Subscriber ID Subscriber Name [facility] Subscriber Address   [id number] Joann [name], Joann [name] [id number] [address] [place], [place] [address]           Secondary Coverage      Payer Plan Group Number Group Name Payer Phone Plan Phone   No coverage found

## 2024-06-10 DEATH — deceased
